# Patient Record
Sex: Male | Born: 1945 | Race: White | Hispanic: No | Marital: Married | Smoking: Never smoker
Health system: Southern US, Community
[De-identification: ages and names within clinical notes are randomized; demographics above are authoritative.]

## PROBLEM LIST (undated history)

## (undated) DIAGNOSIS — I6523 Occlusion and stenosis of bilateral carotid arteries: Secondary | ICD-10-CM

## (undated) DIAGNOSIS — Z8669 Personal history of other diseases of the nervous system and sense organs: Secondary | ICD-10-CM

## (undated) DIAGNOSIS — D649 Anemia, unspecified: Secondary | ICD-10-CM

## (undated) DIAGNOSIS — D126 Benign neoplasm of colon, unspecified: Secondary | ICD-10-CM

## (undated) DIAGNOSIS — E785 Hyperlipidemia, unspecified: Secondary | ICD-10-CM

## (undated) DIAGNOSIS — G4730 Sleep apnea, unspecified: Secondary | ICD-10-CM

## (undated) DIAGNOSIS — M1990 Unspecified osteoarthritis, unspecified site: Secondary | ICD-10-CM

## (undated) DIAGNOSIS — N400 Enlarged prostate without lower urinary tract symptoms: Secondary | ICD-10-CM

## (undated) HISTORY — PX: TONSILLECTOMY: SUR1361

## (undated) HISTORY — PX: STRABISMUS SURGERY: SHX218

## (undated) HISTORY — DX: Benign prostatic hyperplasia without lower urinary tract symptoms: N40.0

## (undated) HISTORY — PX: COLONOSCOPY: SHX174

---

## 1991-01-28 HISTORY — PX: CATARACT EXTRACTION, BILATERAL: SHX1313

## 2008-06-01 ENCOUNTER — Ambulatory Visit: Payer: Self-pay | Admitting: Unknown Physician Specialty

## 2013-09-26 DIAGNOSIS — D649 Anemia, unspecified: Secondary | ICD-10-CM | POA: Insufficient documentation

## 2013-09-26 DIAGNOSIS — E785 Hyperlipidemia, unspecified: Secondary | ICD-10-CM | POA: Insufficient documentation

## 2014-09-29 DIAGNOSIS — N400 Enlarged prostate without lower urinary tract symptoms: Secondary | ICD-10-CM | POA: Insufficient documentation

## 2014-10-03 ENCOUNTER — Other Ambulatory Visit: Payer: Self-pay | Admitting: Internal Medicine

## 2014-10-03 DIAGNOSIS — Z136 Encounter for screening for cardiovascular disorders: Secondary | ICD-10-CM

## 2014-10-03 DIAGNOSIS — Z87891 Personal history of nicotine dependence: Secondary | ICD-10-CM

## 2014-10-09 ENCOUNTER — Ambulatory Visit: Payer: Self-pay

## 2014-10-12 ENCOUNTER — Ambulatory Visit: Payer: Medicare PPO

## 2014-10-23 ENCOUNTER — Ambulatory Visit
Admission: RE | Admit: 2014-10-23 | Discharge: 2014-10-23 | Disposition: A | Discharge status: Home / Self Care | Payer: Medicare PPO | Source: Ambulatory Visit | Attending: Internal Medicine | Admitting: Internal Medicine

## 2014-10-23 DIAGNOSIS — Z87891 Personal history of nicotine dependence: Secondary | ICD-10-CM

## 2014-10-23 DIAGNOSIS — Z136 Encounter for screening for cardiovascular disorders: Secondary | ICD-10-CM

## 2016-06-12 IMAGING — US US RETROPERITONEAL COMPLETE
1 series · 14 of 25 positions shown · non-contrast
Comparison: None.

CLINICAL DATA: Former smoker.  Abdominal aortic aneurysm screening.

EXAM:
ULTRASOUND RETROPERITONEAL COMPLETE
TECHNIQUE: Ultrasound examination of the abdominal aorta was performed to
evaluate for abdominal aortic aneurysm. The common iliac arteries,
IVC, and kidneys were also evaluated.

[Series 1: us retroperitoneal complete · 0.30mm/px · 14 of 48 slices shown]
[im 1/48]
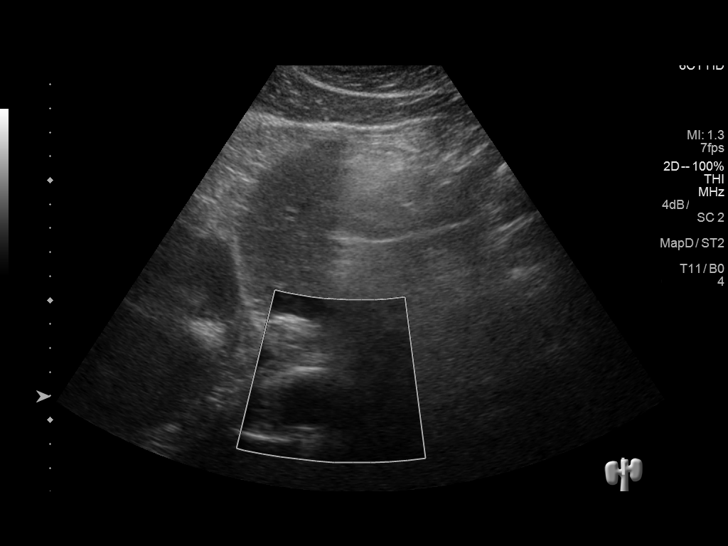
[im 4/48]
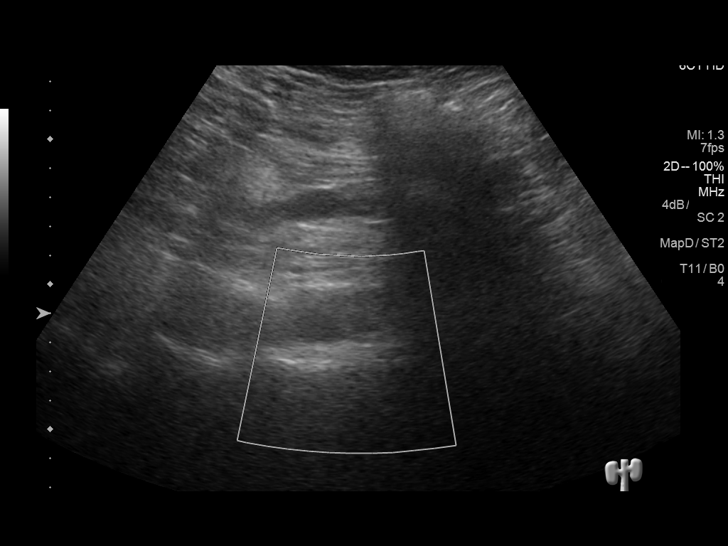
[im 8/48]
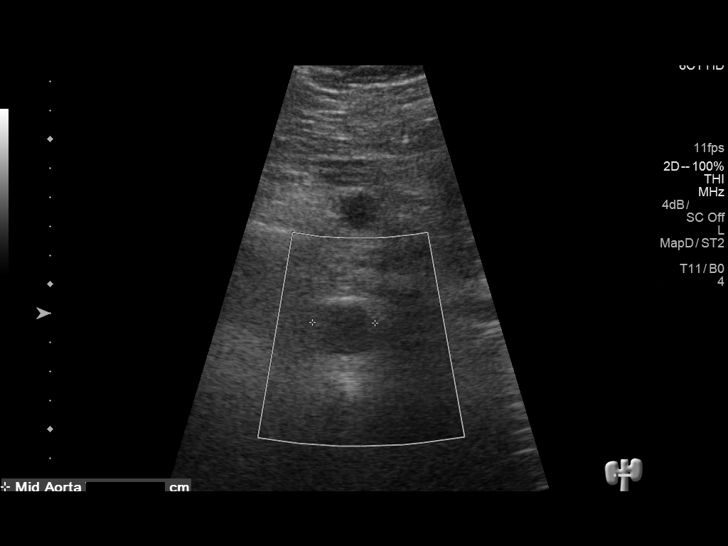
[im 12/48]
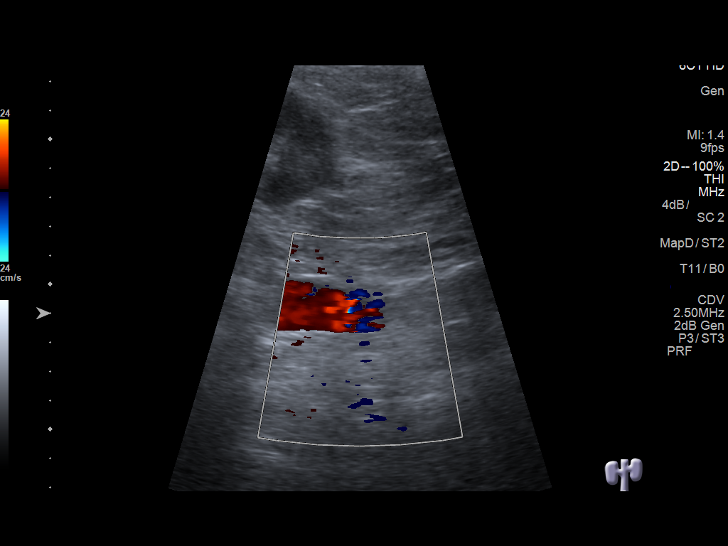
[im 16/48]
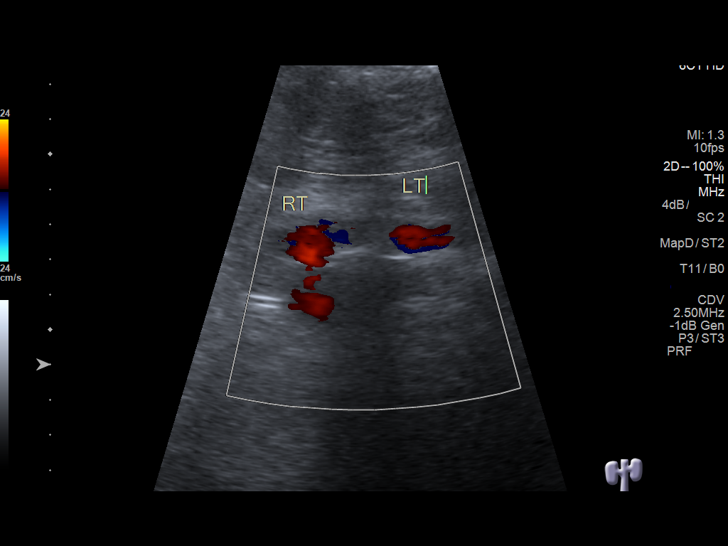
[im 18/48]
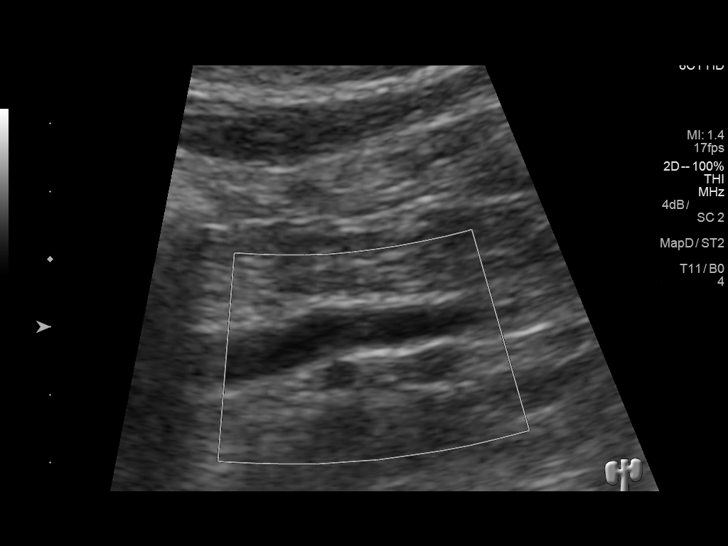
[im 22/48]
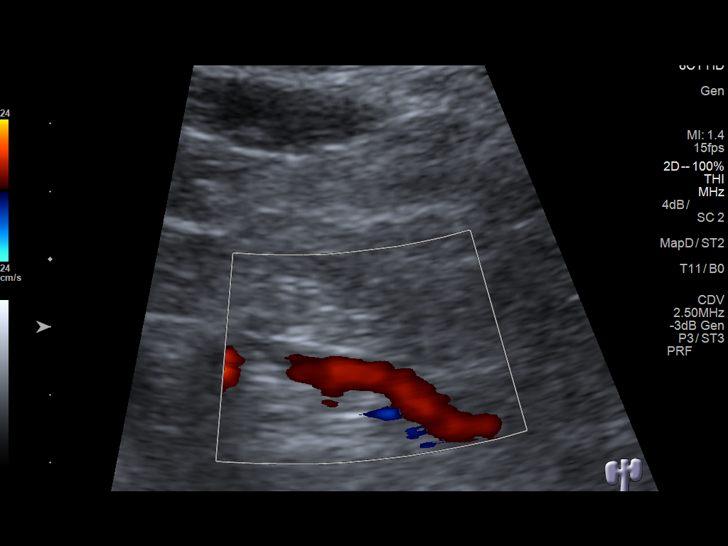
[im 26/48]
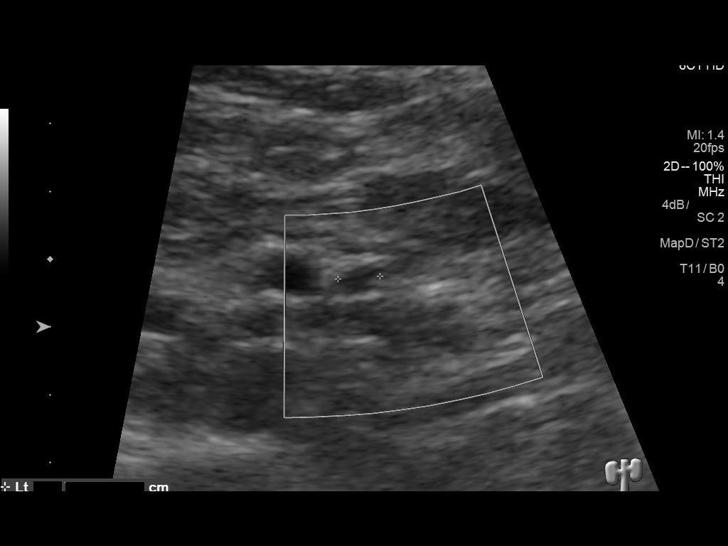
[im 30/48]
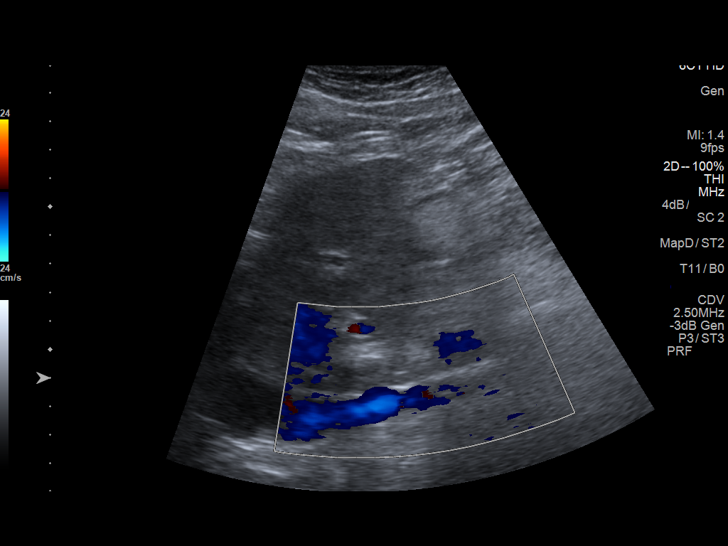
[im 32/48]
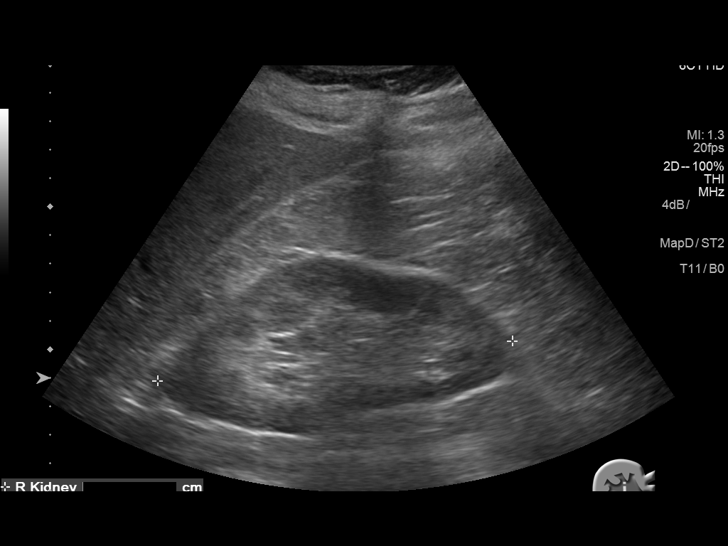
[im 36/48]
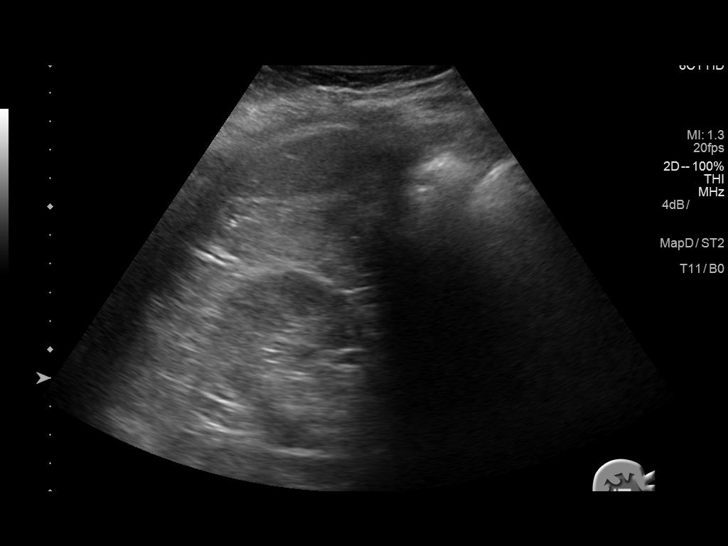
[im 40/48]
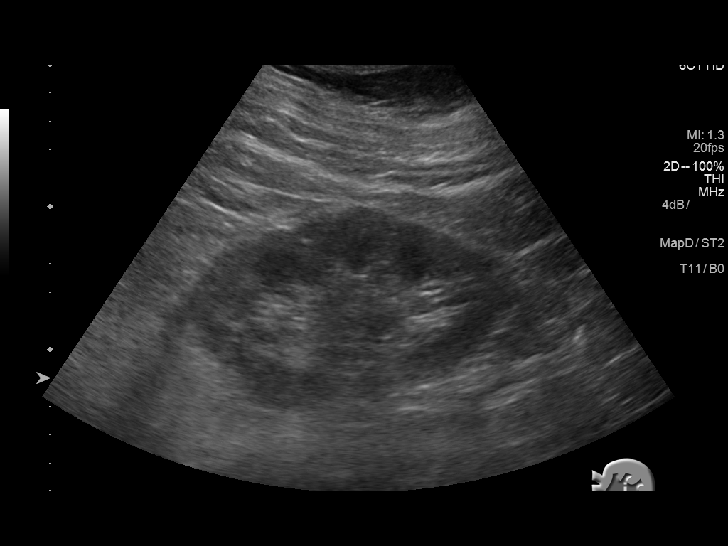
[im 44/48]
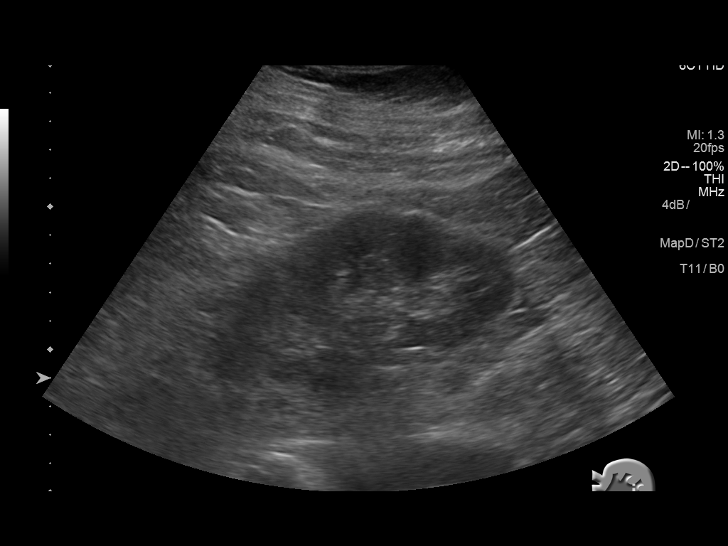
[im 48/48]
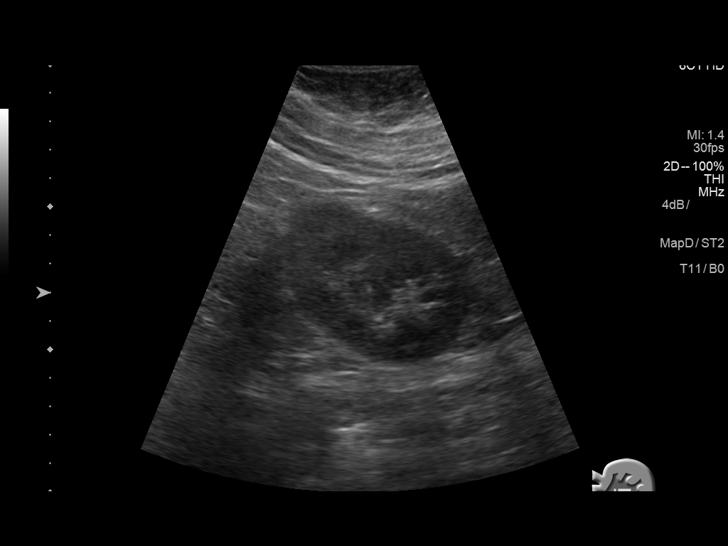

[14 of 25 positions shown; findings below may reference images not displayed]

FINDINGS: Abdominal Aorta

No aneurysm identified.

Maximum AP

Diameter:  2.5 cm

Maximum TRV

Diameter: 2.5 cm

Right Common Iliac Artery

No aneurysm identified.  8 mm diameter.

Left Common Iliac Artery

No aneurysm identified.  6 mm diameter.

IVC

No abnormality visualized.

Right Kidney

Length: 12.5 cm Echogenicity within normal limits. No mass or
hydronephrosis visualized.

Left Kidney

Length: 11.5 cm Echogenicity within normal limits. No mass or
hydronephrosis visualized.
IMPRESSION: No abdominal aortic aneurysm.

## 2019-03-27 ENCOUNTER — Ambulatory Visit: Payer: Medicare PPO | Attending: Internal Medicine

## 2019-03-27 DIAGNOSIS — Z23 Encounter for immunization: Secondary | ICD-10-CM

## 2019-03-27 NOTE — Progress Notes (Signed)
   Covid-19 Vaccination Clinic  Name:  Rodney Schmidt    MRN: [id number] DOB: 04-Oct-1945  03/27/2019  [name] was observed post Covid-19 immunization for 15 minutes without incidence. He was provided with Vaccine Information Sheet and instruction to access the V-Safe system.   [name] was instructed to call 911 with any severe reactions post vaccine: Marland Kitchen Difficulty breathing  . Swelling of your face and throat  . A fast heartbeat  . A bad rash all over your body  . Dizziness and weakness    Immunizations Administered    Name Date Dose VIS Date Route   Pfizer COVID-19 Vaccine 03/27/2019  9:23 AM 0.3 mL 01/07/2019 Intramuscular   Manufacturer: [place]   Lot: [id number]   [place]: [id number]

## 2019-04-18 ENCOUNTER — Ambulatory Visit: Payer: Medicare PPO | Attending: Internal Medicine

## 2019-04-18 DIAGNOSIS — Z23 Encounter for immunization: Secondary | ICD-10-CM

## 2019-04-18 NOTE — Progress Notes (Signed)
   Covid-19 Vaccination Clinic  Name:  Rodney Schmidt    MRN: [id number] DOB: 10/25/1945  04/18/2019  Mr. Rodney Schmidt was observed post Covid-19 immunization for 15 minutes without incident. He was provided with Vaccine Information Sheet and instruction to access the V-Safe system.   Mr. Rodney Schmidt was instructed to call 911 with any severe reactions post vaccine: Marland Kitchen Difficulty breathing  . Swelling of face and throat  . A fast heartbeat  . A bad rash all over body  . Dizziness and weakness   Immunizations Administered    Name Date Dose VIS Date Route   Pfizer COVID-19 Vaccine 04/18/2019  9:02 AM 0.3 mL 01/07/2019 Intramuscular   Manufacturer: [place]   Lot: [id number]   [place]: [id number]

## 2019-10-18 LAB — COLOGUARD: COLOGUARD: NEGATIVE

## 2020-02-07 ENCOUNTER — Encounter: Payer: Self-pay | Admitting: Urology

## 2020-02-07 ENCOUNTER — Other Ambulatory Visit: Payer: Self-pay

## 2020-02-07 ENCOUNTER — Other Ambulatory Visit
Admission: RE | Admit: 2020-02-07 | Discharge: 2020-02-07 | Disposition: A | Discharge status: Home / Self Care | Payer: Medicare PPO | Attending: Urology | Admitting: Urology

## 2020-02-07 ENCOUNTER — Ambulatory Visit: Payer: Medicare PPO | Admitting: Urology

## 2020-02-07 VITALS — BP 167/66 | HR 71 | Ht 64.5 in | Wt 177.8 lb

## 2020-02-07 DIAGNOSIS — N400 Enlarged prostate without lower urinary tract symptoms: Secondary | ICD-10-CM | POA: Diagnosis not present

## 2020-02-07 DIAGNOSIS — Z125 Encounter for screening for malignant neoplasm of prostate: Secondary | ICD-10-CM

## 2020-02-07 DIAGNOSIS — N401 Enlarged prostate with lower urinary tract symptoms: Secondary | ICD-10-CM

## 2020-02-07 DIAGNOSIS — N138 Other obstructive and reflux uropathy: Secondary | ICD-10-CM

## 2020-02-07 DIAGNOSIS — M1990 Unspecified osteoarthritis, unspecified site: Secondary | ICD-10-CM | POA: Insufficient documentation

## 2020-02-07 DIAGNOSIS — H509 Unspecified strabismus: Secondary | ICD-10-CM | POA: Insufficient documentation

## 2020-02-07 LAB — URINALYSIS, COMPLETE (UACMP) WITH MICROSCOPIC
Bacteria, UA: NONE SEEN
Bilirubin Urine: NEGATIVE
Glucose, UA: NEGATIVE mg/dL
Hgb urine dipstick: NEGATIVE
Ketones, ur: NEGATIVE mg/dL
Leukocytes,Ua: NEGATIVE
Nitrite: NEGATIVE
Protein, ur: NEGATIVE mg/dL
Specific Gravity, Urine: 1.02 (ref 1.005–1.030)
Squamous Epithelial / LPF: NONE SEEN (ref 0–5)
pH: 7.5 (ref 5.0–8.0)

## 2020-02-07 LAB — BLADDER SCAN AMB NON-IMAGING

## 2020-02-07 NOTE — Progress Notes (Signed)
   02/07/20 3:14 PM   Pierce Crane Mataya 03-Feb-1945 [id number]  CC: BPH and urinary symptoms, PSA screening  HPI: I saw Mr. Dinapoli in urology clinic for the above issues.  He is a 75 year old very healthy male with long history of bothersome urinary symptoms who has been on maximal medical therapy with Flomax and finasteride over the last 18 months.  His urinary symptoms have continued to worsen, and he is interested in surgical options to improve his urination.  His primary complaints are weak stream, urgency, nocturia 1-2 times overnight.  IPSS score today is 14, with quality of life mixed.  PSA is normal at 0.94, corrected for finasteride 1.8.  Urinalysis today is completely benign.  PVR is 129 mL.   Social History:  reports that he has never smoked. He has never used smokeless tobacco. He reports previous alcohol use. He reports that he does not use drugs.  Physical Exam: BP (!) 167/66 (BP Location: Left Arm, Patient Position: Sitting, Cuff Size: Normal)   Pulse 71   Ht 5' 4.5" (1.638 m)   Wt 177 lb 12.8 oz (80.6 kg)   BMI 30.05 kg/m    Constitutional:  Alert and oriented, No acute distress. Cardiovascular: No clubbing, cyanosis, or edema. Respiratory: Normal respiratory effort, no increased work of breathing. GI: Abdomen is soft, nontender, nondistended, no abdominal masses GU: Phallus with patent meatus, no lesions, testicles 20 cc and descended bilaterally DRE :30 g, smooth, no nodules or masses  Laboratory Data: Reviewed, see HPI  Assessment & Plan:   75 year old very healthy male with persistent bothersome urinary BPH symptoms despite maximal medical therapy of Flomax and finasteride.  DRE is benign and shows a small prostate.  I recommended cystoscopy and TRUS for further evaluation, and consideration of UroLift.  We discussed the differences between UroLift and HOLEP, and with his small prostate and UroLift may be a better option.  Risks and benefits discussed at  length.  Continue Flomax and finasteride Follow-up for cystoscopy and TRUS for consideration of outlet procedure  [name], MD 02/07/2020  [facility] [address], [place], [place] [address] [phone]

## 2020-02-07 NOTE — Patient Instructions (Signed)
Cystoscopy Cystoscopy is a procedure that is used to help diagnose and sometimes treat conditions that affect the lower urinary tract. The lower urinary tract includes the bladder and the urethra. The urethra is the tube that drains urine from the bladder. Cystoscopy is done using a thin, tube-shaped instrument with a light and camera at the end (cystoscope). The cystoscope may be hard or flexible, depending on the goal of the procedure. The cystoscope is inserted through the urethra, into the bladder. Cystoscopy may be recommended if you have:  Urinary tract infections that keep coming back.  Blood in the urine (hematuria).  An inability to control when you urinate (urinary incontinence) or an overactive bladder.  Unusual cells found in a urine sample.  A blockage in the urethra, such as a urinary stone.  Painful urination.  An abnormality in the bladder found during an intravenous pyelogram (IVP) or CT scan. Cystoscopy may also be done to remove a sample of tissue to be examined under a microscope (biopsy). What are the risks? Generally, this is a safe procedure. However, problems may occur, including:  Infection.  Bleeding.  What happens during the procedure?  1. You will be given one or more of the following: ? A medicine to numb the area (local anesthetic). 2. The area around the opening of your urethra will be cleaned. 3. The cystoscope will be passed through your urethra into your bladder. 4. Germ-free (sterile) fluid will flow through the cystoscope to fill your bladder. The fluid will stretch your bladder so that your health care provider can clearly examine your bladder walls. 5. Your doctor will look at the urethra and bladder. 6. The cystoscope will be removed The procedure may vary among health care providers  What can I expect after the procedure? After the procedure, it is common to have: 1. Some soreness or pain in your abdomen and urethra. 2. Urinary symptoms.  These include: ? Mild pain or burning when you urinate. Pain should stop within a few minutes after you urinate. This may last for up to 1 week. ? A small amount of blood in your urine for several days. ? Feeling like you need to urinate but producing only a small amount of urine. Follow these instructions at home: General instructions  Return to your normal activities as told by your health care provider.   Do not drive for 24 hours if you were given a sedative during your procedure.  Watch for any blood in your urine. If the amount of blood in your urine increases, call your health care provider.  If a tissue sample was removed for testing (biopsy) during your procedure, it is up to you to get your test results. Ask your health care provider, or the department that is doing the test, when your results will be ready.  Drink enough fluid to keep your urine pale yellow.  Keep all follow-up visits as told by your health care provider. This is important. Contact a health care provider if you:  Have pain that gets worse or does not get better with medicine, especially pain when you urinate.  Have trouble urinating.  Have more blood in your urine. Get help right away if you:  Have blood clots in your urine.  Have abdominal pain.  Have a fever or chills.  Are unable to urinate. Summary  Cystoscopy is a procedure that is used to help diagnose and sometimes treat conditions that affect the lower urinary tract.  Cystoscopy is done using   a thin, tube-shaped instrument with a light and camera at the end.  After the procedure, it is common to have some soreness or pain in your abdomen and urethra.  Watch for any blood in your urine. If the amount of blood in your urine increases, call your health care provider.  If you were prescribed an antibiotic medicine, take it as told by your health care provider. Do not stop taking the antibiotic even if you start to feel better. This  information is not intended to replace advice given to you by your health care provider. Make sure you discuss any questions you have with your health care provider. Document Revised: 01/05/2018 Document Reviewed: 01/05/2018 Elsevier Patient Education  2020 [employer]vier Inc[employer]   

## 2020-02-23 ENCOUNTER — Other Ambulatory Visit: Payer: Self-pay

## 2020-02-23 ENCOUNTER — Encounter: Payer: Self-pay | Admitting: Urology

## 2020-02-23 ENCOUNTER — Ambulatory Visit (INDEPENDENT_AMBULATORY_CARE_PROVIDER_SITE_OTHER): Payer: Medicare PPO | Admitting: Urology

## 2020-02-23 VITALS — BP 160/75 | HR 78 | Ht 64.5 in | Wt 174.2 lb

## 2020-02-23 DIAGNOSIS — N138 Other obstructive and reflux uropathy: Secondary | ICD-10-CM | POA: Diagnosis not present

## 2020-02-23 DIAGNOSIS — N401 Enlarged prostate with lower urinary tract symptoms: Secondary | ICD-10-CM | POA: Diagnosis not present

## 2020-02-23 MED ORDER — LIDOCAINE HCL URETHRAL/MUCOSAL 2 % EX GEL
1.0000 | Freq: Once | CUTANEOUS | Status: AC
Start: 2020-02-23 — End: 2020-02-23
  Administered 2020-02-23: 1 via URETHRAL

## 2020-02-23 NOTE — Progress Notes (Signed)
Cystoscopy Procedure Note:  Indication: BPH and urinary symptoms  After informed consent and discussion of the procedure and its risks, Rodney Schmidt was positioned and prepped in the standard fashion. Cystoscopy was performed with a flexible cystoscope. The urethra, bladder neck and entire bladder was visualized in a standard fashion. The prostate with obstructing lateral lobes and a high bladder neck with very small median lobe. The ureteral orifices were visualized in their normal location and orientation.  Bladder mucosa with mild trabeculations, no abnormalities on retroflexion and minimal intravesical protrusion  Imaging: The transrectal ultrasound probe was inserted into the rectum and measurements were taken to calculated prostate volume of 26 g  Findings: Obstructive appearing 26 g prostate, very small median lobe  ---------------------------------------------------------------------------------------------  Assessment and Plan: 75 year old male with bothersome urinary symptoms despite maximal medical therapy of Flomax and finasteride he is interested in outlet procedures.  Cystoscopy and TRUS today showed a 26 g prostate with obstructing lateral lobes and a very small median lobe.  We reviewed the options at length including ongoing medical therapy, UroLift, or HOLEP.  We discussed that UroLift is the least invasive option in patients typically discharge the same day without a catheter.  Small risk of bleeding, infection, temporary urinary symptoms of urgency/frequency/dysuria/hematuria.  Extremely low risk of incontinence, no risk of erectile dysfunction or retrograde ejaculation.  We discussed the risks and benefits of HoLEP at length.  The procedure requires general anesthesia and takes 2 to 3 hours, and a holmium laser is used to enucleate the prostate and push this tissue into the bladder.  A morcellator is then used to remove this tissue, which is sent for pathology.  The vast  majority of patients are able to discharge the same day with a catheter in place for 2 to 3 days, and will follow-up in clinic for a voiding trial.  Approximately 5% of patients will be admitted overnight to monitor the urine, or if they have multiple co-morbidities.  We specifically discussed the risks of bleeding, infection, retrograde ejaculation, temporary urgency and urge incontinence, very low risk of long-term incontinence, pathologic evaluation of prostate tissue and possible detection of prostate cancer or other malignancy, and possible need for additional procedures.  He would like to pursue UroLift, will schedule  [name], MD 02/23/2020

## 2020-05-16 ENCOUNTER — Telehealth: Payer: Self-pay | Admitting: Urology

## 2020-05-16 NOTE — Telephone Encounter (Signed)
Patient called the office today to follow up on his visit with Dr. Diamantina Providence in January.  He is wanting to schedule surgery for Urolift.  Please contact patient to make the necessary arrangements.

## 2020-05-16 NOTE — Telephone Encounter (Signed)
Please advise.  Per Denny Peon, she does not have a surgery posting sheet on this patient for Urolift.  Do you still want him to be scheduled for Urolift

## 2020-05-17 ENCOUNTER — Other Ambulatory Visit: Payer: Self-pay | Admitting: Urology

## 2020-05-17 DIAGNOSIS — N138 Other obstructive and reflux uropathy: Secondary | ICD-10-CM

## 2020-05-18 ENCOUNTER — Other Ambulatory Visit: Payer: Self-pay

## 2020-05-18 ENCOUNTER — Other Ambulatory Visit: Payer: Medicare PPO

## 2020-05-18 DIAGNOSIS — N400 Enlarged prostate without lower urinary tract symptoms: Secondary | ICD-10-CM

## 2020-05-18 LAB — URINALYSIS, COMPLETE
Bilirubin, UA: NEGATIVE
Glucose, UA: NEGATIVE
Leukocytes,UA: NEGATIVE
Nitrite, UA: NEGATIVE
Protein,UA: NEGATIVE
RBC, UA: NEGATIVE
Specific Gravity, UA: 1.02 (ref 1.005–1.030)
Urobilinogen, Ur: 0.2 mg/dL (ref 0.2–1.0)
pH, UA: 5 (ref 5.0–7.5)

## 2020-05-18 LAB — MICROSCOPIC EXAMINATION: Bacteria, UA: NONE SEEN

## 2020-05-22 ENCOUNTER — Other Ambulatory Visit
Admission: RE | Admit: 2020-05-22 | Discharge: 2020-05-22 | Disposition: A | Discharge status: Home / Self Care | Payer: Medicare PPO | Source: Ambulatory Visit | Attending: Urology | Admitting: Urology

## 2020-05-22 ENCOUNTER — Other Ambulatory Visit: Payer: Self-pay

## 2020-05-22 ENCOUNTER — Encounter
Admission: RE | Admit: 2020-05-22 | Discharge: 2020-05-22 | Disposition: A | Discharge status: Home / Self Care | Payer: Medicare PPO | Source: Ambulatory Visit | Attending: Urology | Admitting: Urology

## 2020-05-22 DIAGNOSIS — Z01810 Encounter for preprocedural cardiovascular examination: Secondary | ICD-10-CM | POA: Diagnosis present

## 2020-05-22 HISTORY — DX: Hyperlipidemia, unspecified: E78.5

## 2020-05-22 HISTORY — DX: Anemia, unspecified: D64.9

## 2020-05-22 HISTORY — DX: Unspecified osteoarthritis, unspecified site: M19.90

## 2020-05-22 HISTORY — DX: Personal history of other diseases of the nervous system and sense organs: Z86.69

## 2020-05-22 HISTORY — DX: Benign neoplasm of colon, unspecified: D12.6

## 2020-05-22 NOTE — Patient Instructions (Signed)
Your procedure is scheduled on: 05/25/20 Report to [facility]. To find out your arrival time please call [phone] between 1PM - 3PM on 05/24/20.  Remember: Instructions that are not followed completely may result in serious medical risk, up to and including death, or upon the discretion of your surgeon and anesthesiologist your surgery may need to be rescheduled.     _X__ 1. Do not eat food after midnight the night before your procedure.                 No gum chewing or hard candies. You may drink clear liquids up to 2 hours                 before you are scheduled to arrive for your surgery- DO not drink clear                 liquids within 2 hours of the start of your surgery.                 Clear Liquids include:  water, apple juice without pulp, clear carbohydrate                 drink such as Clearfast or Gatorade, Black Coffee or Tea (Do not add                 anything to coffee or tea). Diabetics water only  __X__2.  On the morning of surgery brush your teeth with toothpaste and water, you                 may rinse your mouth with mouthwash if you wish.  Do not swallow any              toothpaste of mouthwash.     _X__ 3.  No Alcohol for 24 hours before or after surgery.   _X__ 4.  Do Not Smoke or use e-cigarettes For 24 Hours Prior to Your Surgery.                 Do not use any chewable tobacco products for at least 6 hours prior to                 surgery.  ____  5.  Bring all medications with you on the day of surgery if instructed.   __X__  6.  Notify your doctor if there is any change in your medical condition      (cold, fever, infections).     Do not wear jewelry, make-up, hairpins, clips or nail polish. Do not wear lotions, powders, or perfumes.  Do not shave 48 hours prior to surgery. Men may shave face and neck. Do not bring valuables to the hospital.    [facility] is not responsible for any belongings or  valuables.  Contacts, dentures/partials or body piercings may not be worn into surgery. Bring a case for your contacts, glasses or hearing aids, a denture cup will be supplied. Leave your suitcase in the car. After surgery it may be brought to your room. For patients admitted to the hospital, discharge time is determined by your treatment team.   Patients discharged the day of surgery will not be allowed to drive home.   Please read over the following fact sheets that you were given:     __X__ Take these medicines the morning of surgery with A SIP OF WATER:  1. finasteride (PROSCAR) 5 MG tablet  2. tamsulosin (FLOMAX) 0.4 MG CAPS capsule  3.   4.  5.  6.  ____ Fleet Enema (as directed)   ____ Use CHG Soap/SAGE wipes as directed  ____ Use inhalers on the day of surgery  ____ Stop metformin/Janumet/Farxiga 2 days prior to surgery    ____ Take 1/2 of usual insulin dose the night before surgery. No insulin the morning          of surgery.   ____ Stop Blood Thinners Coumadin/Plavix/Xarelto/Pleta/Pradaxa/Eliquis/Effient/Aspirin  on   Or contact your Surgeon, Cardiologist or Medical Doctor regarding  ability to stop your blood thinners  __X__ Stop Anti-inflammatories 7 days before surgery such as Advil, Ibuprofen, Motrin,  BC or Goodies Powder, Naprosyn, Naproxen, Aleve, Aspirin    __X__ Stop all herbal supplements, fish oil or vitamin E until after surgery.    ____ Bring C-Pap to the hospital.

## 2020-05-24 LAB — CULTURE, URINE COMPREHENSIVE

## 2020-05-25 ENCOUNTER — Ambulatory Visit
Admission: RE | Admit: 2020-05-25 | Discharge: 2020-05-25 | Disposition: A | Discharge status: Home / Self Care | Payer: Medicare PPO | Attending: Urology | Admitting: Urology

## 2020-05-25 ENCOUNTER — Encounter
Admission: RE | Disposition: A | Discharge status: Home / Self Care | Payer: Self-pay | Source: Home / Self Care | Attending: Urology

## 2020-05-25 ENCOUNTER — Ambulatory Visit: Payer: Medicare PPO | Admitting: Certified Registered Nurse Anesthetist

## 2020-05-25 ENCOUNTER — Encounter: Payer: Self-pay | Admitting: Urology

## 2020-05-25 DIAGNOSIS — N138 Other obstructive and reflux uropathy: Secondary | ICD-10-CM | POA: Insufficient documentation

## 2020-05-25 DIAGNOSIS — N401 Enlarged prostate with lower urinary tract symptoms: Secondary | ICD-10-CM | POA: Insufficient documentation

## 2020-05-25 DIAGNOSIS — R3915 Urgency of urination: Secondary | ICD-10-CM | POA: Diagnosis not present

## 2020-05-25 DIAGNOSIS — R351 Nocturia: Secondary | ICD-10-CM | POA: Diagnosis not present

## 2020-05-25 HISTORY — PX: CYSTOSCOPY WITH INSERTION OF UROLIFT: SHX6678

## 2020-05-25 SURGERY — CYSTOSCOPY WITH INSERTION OF UROLIFT
Anesthesia: General | Site: Prostate

## 2020-05-25 MED ORDER — LIDOCAINE HCL (CARDIAC) PF 100 MG/5ML IV SOSY
PREFILLED_SYRINGE | INTRAVENOUS | Status: DC | PRN
  Administered 2020-05-25: 50 mg via INTRAVENOUS

## 2020-05-25 MED ORDER — LACTATED RINGERS IV SOLN: INTRAVENOUS | Status: DC

## 2020-05-25 MED ORDER — ONDANSETRON HCL 4 MG/2ML IJ SOLN: 4.0000 mg | Freq: Once | INTRAMUSCULAR | Status: DC | PRN

## 2020-05-25 MED ORDER — PROPOFOL 500 MG/50ML IV EMUL
INTRAVENOUS | Status: AC
  Filled 2020-05-25: qty 50

## 2020-05-25 MED ORDER — LIDOCAINE HCL URETHRAL/MUCOSAL 2 % EX GEL
CUTANEOUS | Status: AC
  Filled 2020-05-25: qty 10

## 2020-05-25 MED ORDER — SEVOFLURANE IN SOLN
RESPIRATORY_TRACT | Status: AC
  Filled 2020-05-25: qty 250

## 2020-05-25 MED ORDER — FENTANYL CITRATE (PF) 100 MCG/2ML IJ SOLN
INTRAMUSCULAR | Status: DC | PRN
  Administered 2020-05-25 (×2): 50 ug via INTRAVENOUS

## 2020-05-25 MED ORDER — FAMOTIDINE 20 MG PO TABS: 20.0000 mg | ORAL_TABLET | Freq: Once | ORAL | Status: AC

## 2020-05-25 MED ORDER — ORAL CARE MOUTH RINSE: 15.0000 mL | Freq: Once | OROMUCOSAL | Status: AC

## 2020-05-25 MED ORDER — CEFAZOLIN SODIUM-DEXTROSE 2-4 GM/100ML-% IV SOLN
INTRAVENOUS | Status: AC
  Filled 2020-05-25: qty 100

## 2020-05-25 MED ORDER — PROPOFOL 500 MG/50ML IV EMUL
INTRAVENOUS | Status: DC | PRN
  Administered 2020-05-25: 125 ug/kg/min via INTRAVENOUS

## 2020-05-25 MED ORDER — PROPOFOL 10 MG/ML IV BOLUS
INTRAVENOUS | Status: DC | PRN
  Administered 2020-05-25 (×2): 20 mg via INTRAVENOUS

## 2020-05-25 MED ORDER — FENTANYL CITRATE (PF) 100 MCG/2ML IJ SOLN
INTRAMUSCULAR | Status: AC
  Filled 2020-05-25: qty 2

## 2020-05-25 MED ORDER — ONDANSETRON HCL 4 MG/2ML IJ SOLN
INTRAMUSCULAR | Status: DC | PRN
  Administered 2020-05-25: 4 mg via INTRAVENOUS

## 2020-05-25 MED ORDER — FAMOTIDINE 20 MG PO TABS
ORAL_TABLET | ORAL | Status: AC
  Administered 2020-05-25: 20 mg via ORAL
  Filled 2020-05-25: qty 1

## 2020-05-25 MED ORDER — CHLORHEXIDINE GLUCONATE 0.12 % MT SOLN: 15.0000 mL | Freq: Once | OROMUCOSAL | Status: AC

## 2020-05-25 MED ORDER — HYDROCODONE-ACETAMINOPHEN 5-325 MG PO TABS: 1.0000 | ORAL_TABLET | ORAL | 0 refills | Status: AC | PRN

## 2020-05-25 MED ORDER — DEXAMETHASONE SODIUM PHOSPHATE 10 MG/ML IJ SOLN
INTRAMUSCULAR | Status: DC | PRN
  Administered 2020-05-25: 10 mg via INTRAVENOUS

## 2020-05-25 MED ORDER — CEFAZOLIN SODIUM-DEXTROSE 2-4 GM/100ML-% IV SOLN
2.0000 g | INTRAVENOUS | Status: AC
Start: 2020-05-25 — End: 2020-05-25
  Administered 2020-05-25: 2 g via INTRAVENOUS

## 2020-05-25 MED ORDER — FENTANYL CITRATE (PF) 100 MCG/2ML IJ SOLN: 25.0000 ug | INTRAMUSCULAR | Status: DC | PRN

## 2020-05-25 MED ORDER — CHLORHEXIDINE GLUCONATE 0.12 % MT SOLN
OROMUCOSAL | Status: AC
  Administered 2020-05-25: 15 mL via OROMUCOSAL
  Filled 2020-05-25: qty 15

## 2020-05-25 SURGICAL SUPPLY — 17 items
BAG DRAIN CYSTO-URO LG1000N (MISCELLANEOUS) ×2 IMPLANT
BRUSH SCRUB EZ  4% CHG (MISCELLANEOUS) ×1
BRUSH SCRUB EZ 4% CHG (MISCELLANEOUS) ×1 IMPLANT
GLOVE SURG UNDER POLY LF SZ7.5 (GLOVE) ×2 IMPLANT
GOWN STRL REUS W/ TWL LRG LVL3 (GOWN DISPOSABLE) ×1 IMPLANT
GOWN STRL REUS W/ TWL XL LVL3 (GOWN DISPOSABLE) ×1 IMPLANT
GOWN STRL REUS W/TWL LRG LVL3 (GOWN DISPOSABLE) ×2
GOWN STRL REUS W/TWL XL LVL3 (GOWN DISPOSABLE) ×2
KIT TURNOVER CYSTO (KITS) ×2 IMPLANT
MANIFOLD NEPTUNE II (INSTRUMENTS) ×2 IMPLANT
PACK CYSTO AR (MISCELLANEOUS) ×2 IMPLANT
SET CYSTO W/LG BORE CLAMP LF (SET/KITS/TRAYS/PACK) ×2 IMPLANT
SURGILUBE 2OZ TUBE FLIPTOP (MISCELLANEOUS) IMPLANT
SYSTEM ATC UROLIFT (Male Continence) ×2 IMPLANT
SYSTEM UROLIFT (Male Continence) ×8 IMPLANT
WATER STERILE IRR 1000ML POUR (IV SOLUTION) ×2 IMPLANT
WATER STERILE IRR 3000ML UROMA (IV SOLUTION) ×2 IMPLANT

## 2020-05-25 NOTE — Anesthesia Preprocedure Evaluation (Signed)
Anesthesia Evaluation  Patient identified by MRN, date of birth, ID band Patient awake    Reviewed: Allergy & Precautions, NPO status , Patient's Chart, lab work & pertinent test results  History of Anesthesia Complications Negative for: history of anesthetic complications  Airway Mallampati: II       Dental   Pulmonary neg sleep apnea, neg COPD, Not current smoker,           Cardiovascular (-) hypertension(-) Past MI and (-) CHF (-) dysrhythmias (-) Valvular Problems/Murmurs     Neuro/Psych neg Seizures    GI/Hepatic Neg liver ROS, neg GERD  ,  Endo/Other  neg diabetes  Renal/GU negative Renal ROS     Musculoskeletal   Abdominal   Peds  Hematology   Anesthesia Other Findings   Reproductive/Obstetrics (+) Breast feeding                              Anesthesia Physical Anesthesia Plan  ASA: I  Anesthesia Plan: General   Post-op Pain Management:    Induction: Intravenous  PONV Risk Score and Plan: 2 and Propofol infusion and TIVA  Airway Management Planned:   Additional Equipment:   Intra-op Plan:   Post-operative Plan:   Informed Consent: I have reviewed the patients History and Physical, chart, labs and discussed the procedure including the risks, benefits and alternatives for the proposed anesthesia with the patient or authorized representative who has indicated his/her understanding and acceptance.       Plan Discussed with:   Anesthesia Plan Comments:         Anesthesia Quick Evaluation

## 2020-05-25 NOTE — Anesthesia Postprocedure Evaluation (Signed)
Anesthesia Post Note  Patient: Rodney Schmidt  Procedure(s) Performed: CYSTOSCOPY WITH INSERTION OF UROLIFT (N/A Prostate)  Patient location during evaluation: PACU Anesthesia Type: General Level of consciousness: awake and alert and oriented Pain management: pain level controlled Vital Signs Assessment: post-procedure vital signs reviewed and stable Respiratory status: spontaneous breathing, nonlabored ventilation and respiratory function stable Cardiovascular status: blood pressure returned to baseline and stable Postop Assessment: no signs of nausea or vomiting Anesthetic complications: no   No complications documented.   Last Vitals:  Vitals:   05/25/20 1130 05/25/20 1139  BP: (!) 101/58 (!) 109/46  Pulse: (!) 57 (!) 55  Resp: 12 16  Temp: (!) 36.3 C (!) 36.2 C  SpO2: 95% 100%    Last Pain:  Vitals:   05/25/20 1139  TempSrc: Temporal  PainSc: 0-No pain                 Brogen Duell

## 2020-05-25 NOTE — Op Note (Signed)
Date of procedure: 05/25/20  Preoperative diagnosis: 1. BPH with obstruction  Postoperative diagnosis: 1. Same  Procedure: 1. UroLift(total of6implants placed)  Surgeon: [name], MD  Anesthesia: General  Complications:None  Intraoperative findings: 1.Small prostate with moderate sized median lobe, open channel at conclusion  JEH:[id number]  Specimens:None  Drains:None  Indication:Rodney Schmidt is a 16 year oldpatient with BPH andbothersome urinary symptoms secondary to BPH. He has persistent bothersome symptoms despite multiple medications. After reviewing the management options for treatment, they elected to proceed with the above surgical procedure(s). We have discussed the potential benefits and risks of the procedure, side effects of the proposed treatment, the likelihood of the patient achieving the goals of the procedure, and any potential problems that might occur during the procedure or recuperation. Informed consent has been obtained.  Description of procedure:  The patient was taken to the operating room andanesthesia was induced. SCDs were placed for DVT prophylaxis. The patient was placed in the dorsal lithotomy position, prepped and draped in the usual sterile fashion, and preoperative antibiotics were administered. A preoperative time-out was performed.  The visual obturator with the20French rigid cystoscope was used to intubate the meatus and a normal-appearing urethra was followed proximally into the bladder. The prostate wassmall in sizewith obstructing lateral lobes and a  moderate sized median lobe. Cystoscopy showed mildbladder trabeculations but no suspicious lesions and the ureteral orifices were orthotopic bilaterally.  I started on the right side by placing a UroLift implant at the apex 1 cm distal to the bladder neck. An identical implant was placed on the patient's left side. I then moved back to the  verumontanum, and an additional implant was placed on the patient's right side just proximal to the Veru,andan identical procedure was performed on the left side. At this point I re-examined the channel with the visual obturator andthere was residual obstructive tissue from the median lobe.  The advanced tissue control UroLift device was used to place a fifth  implant, and the median lobe was compressed to the left side.  I again used the visual obturator, and there was still some obstructive median tissue, and a 6th and final implant with the advanced tissue control device was used to completely compress the residual median lobe tissue to the side.  There was an excellent anterior channel at this point. The channel was examined with the visual obturator, and it was open from the verumontanum up to the bladder neck.  There was no significant bleeding.  A total of6UroLift implants were placed.  The bladder was left mildly distended, and the patient will need to void prior to discharge.  Disposition: Stable to PACU  Plan: Patient must void prior to discharge Continue Flomax x1 week Follow-up in 4 weeks forIPSS/PVR  [name], MD

## 2020-05-25 NOTE — Transfer of Care (Signed)
Immediate Anesthesia Transfer of Care Note  Patient: Pierce Crane Torgeson  Procedure(s) Performed: CYSTOSCOPY WITH INSERTION OF UROLIFT (N/A Prostate)  Patient Location: PACU  Anesthesia Type:General  Level of Consciousness: awake, alert  and oriented  Airway & Oxygen Therapy: Patient Spontanous Breathing and Patient connected to face mask oxygen  Post-op Assessment: Report given to RN and Post -op Vital signs reviewed and stable  Post vital signs: Reviewed and stable  Last Vitals:  Vitals Value Taken Time  BP 99/52 05/25/20 1103  Temp    Pulse 59 05/25/20 1104  Resp 17 05/25/20 1104  SpO2 97 % 05/25/20 1104  Vitals shown include unvalidated device data.  Last Pain:  Vitals:   05/25/20 0716  TempSrc: Tympanic  PainSc: 0-No pain         Complications: No complications documented.

## 2020-05-25 NOTE — Discharge Instructions (Signed)

## 2020-05-25 NOTE — H&P (Signed)
   05/25/20 10:10 AM   Rodney Schmidt 01-Aug-1945 [id number]  CC: BPH  HPI:  He is a 75 year old very healthy male with long history of bothersome urinary symptoms who has been on maximal medical therapy with Flomax and finasteride over the last 18 months.  His urinary symptoms have continued to worsen, and he is interested in surgical options to improve his urination.  His primary complaints are weak stream, urgency, nocturia 1-2 times overnight.  IPSS score today is 14, with quality of life mixed.  PSA is normal at 0.94, corrected for finasteride 1.8.  PVR is 129 mL.   PMH: Past Medical History:  Diagnosis Date  . Anemia   . Benign neoplasm of colon   . BPH (benign prostatic hyperplasia)   . HLD (hyperlipidemia)   . Hx of strabismus   . Osteoarthritis     Surgical History: Past Surgical History:  Procedure Laterality Date  . CATARACT EXTRACTION, BILATERAL  1993  . COLONOSCOPY    . STRABISMUS SURGERY    . TONSILLECTOMY      Family History: History reviewed. No pertinent family history.  Social History:  reports that he has never smoked. He has never used smokeless tobacco. He reports current alcohol use of about 2.0 standard drinks of alcohol per week. He reports that he does not use drugs.  Physical Exam: BP (!) 89/76   Pulse 65   Temp (!) 96.8 F (36 C) (Tympanic)   Resp 16   Ht 5\' 5"  (1.651 m)   Wt 67.1 kg   SpO2 96%   BMI 24.63 kg/m    Constitutional:  Alert and oriented, No acute distress. Cardiovascular: Regular rate and rhythm Respiratory: Clear to auscultation bilaterally GI: Abdomen is soft, nontender, nondistended, no abdominal masses   Laboratory Data: Urine culture 4/22 no growth   Assessment & Plan:   75 year old male with 26 g prostate and obstructive urinary symptoms from BPH.  He opted for UroLift.  We discussed the risks and benefits including bleeding, infection, need for additional procedures in the future, possible catheter  placement, and irritative urinary symptoms for 1 to 2 weeks.  UroLift today  [name], MD 05/25/2020  [facility] [address], [place], [place] [address] [phone]

## 2020-07-23 ENCOUNTER — Other Ambulatory Visit: Payer: Self-pay | Admitting: Family Medicine

## 2020-07-23 ENCOUNTER — Other Ambulatory Visit (HOSPITAL_COMMUNITY): Payer: Self-pay | Admitting: Family Medicine

## 2020-07-23 DIAGNOSIS — R55 Syncope and collapse: Secondary | ICD-10-CM

## 2020-07-23 DIAGNOSIS — R42 Dizziness and giddiness: Secondary | ICD-10-CM

## 2020-08-06 ENCOUNTER — Ambulatory Visit: Admission: RE | Admit: 2020-08-06 | Payer: Medicare PPO | Source: Ambulatory Visit

## 2021-12-19 ENCOUNTER — Emergency Department: Payer: Medicare Other

## 2021-12-19 ENCOUNTER — Emergency Department
Admission: EM | Admit: 2021-12-19 | Discharge: 2021-12-19 | Disposition: A | Discharge status: Home / Self Care | Payer: Medicare Other | Attending: Emergency Medicine | Admitting: Emergency Medicine

## 2021-12-19 ENCOUNTER — Other Ambulatory Visit: Payer: Self-pay

## 2021-12-19 DIAGNOSIS — K4090 Unilateral inguinal hernia, without obstruction or gangrene, not specified as recurrent: Secondary | ICD-10-CM | POA: Insufficient documentation

## 2021-12-19 DIAGNOSIS — R102 Pelvic and perineal pain: Secondary | ICD-10-CM | POA: Diagnosis present

## 2021-12-19 LAB — URINALYSIS, ROUTINE W REFLEX MICROSCOPIC
Bilirubin Urine: NEGATIVE
Glucose, UA: NEGATIVE mg/dL
Hgb urine dipstick: NEGATIVE
Ketones, ur: 5 mg/dL — AB
Leukocytes,Ua: NEGATIVE
Nitrite: NEGATIVE
Protein, ur: NEGATIVE mg/dL
Specific Gravity, Urine: 1.02 (ref 1.005–1.030)
pH: 6 (ref 5.0–8.0)

## 2021-12-19 LAB — CBC WITH DIFFERENTIAL/PLATELET
Abs Immature Granulocytes: 0.05 10*3/uL (ref 0.00–0.07)
Basophils Absolute: 0.1 10*3/uL (ref 0.0–0.1)
Basophils Relative: 0 %
Eosinophils Absolute: 0.1 10*3/uL (ref 0.0–0.5)
Eosinophils Relative: 1 %
HCT: 44.7 % (ref 39.0–52.0)
Hemoglobin: 15 g/dL (ref 13.0–17.0)
Immature Granulocytes: 0 %
Lymphocytes Relative: 17 %
Lymphs Abs: 1.9 10*3/uL (ref 0.7–4.0)
MCH: 30.1 pg (ref 26.0–34.0)
MCHC: 33.6 g/dL (ref 30.0–36.0)
MCV: 89.8 fL (ref 80.0–100.0)
Monocytes Absolute: 0.5 10*3/uL (ref 0.1–1.0)
Monocytes Relative: 4 %
Neutro Abs: 8.7 10*3/uL — ABNORMAL HIGH (ref 1.7–7.7)
Neutrophils Relative %: 78 %
Platelets: 263 10*3/uL (ref 150–400)
RBC: 4.98 MIL/uL (ref 4.22–5.81)
RDW: 12.8 % (ref 11.5–15.5)
WBC: 11.3 10*3/uL — ABNORMAL HIGH (ref 4.0–10.5)
nRBC: 0 % (ref 0.0–0.2)

## 2021-12-19 LAB — HEPATIC FUNCTION PANEL
ALT: 15 U/L (ref 0–44)
AST: 21 U/L (ref 15–41)
Albumin: 4.1 g/dL (ref 3.5–5.0)
Alkaline Phosphatase: 56 U/L (ref 38–126)
Bilirubin, Direct: 0.1 mg/dL (ref 0.0–0.2)
Indirect Bilirubin: 1.1 mg/dL — ABNORMAL HIGH (ref 0.3–0.9)
Total Bilirubin: 1.2 mg/dL (ref 0.3–1.2)
Total Protein: 6.8 g/dL (ref 6.5–8.1)

## 2021-12-19 LAB — BASIC METABOLIC PANEL
Anion gap: 8 (ref 5–15)
BUN: 25 mg/dL — ABNORMAL HIGH (ref 8–23)
CO2: 23 mmol/L (ref 22–32)
Calcium: 9.3 mg/dL (ref 8.9–10.3)
Chloride: 107 mmol/L (ref 98–111)
Creatinine, Ser: 0.62 mg/dL (ref 0.61–1.24)
GFR, Estimated: >60 mL/min (ref >60)
Glucose, Bld: 101 mg/dL — ABNORMAL HIGH (ref 70–99)
Potassium: 4.2 mmol/L (ref 3.5–5.1)
Sodium: 138 mmol/L (ref 135–145)

## 2021-12-19 LAB — LIPASE, BLOOD: Lipase: 29 U/L (ref 11–51)

## 2021-12-19 MED ORDER — IOHEXOL 300 MG/ML  SOLN
100.0000 mL | Freq: Once | INTRAMUSCULAR | Status: AC | PRN
  Administered 2021-12-19: 100 mL via INTRAVENOUS

## 2021-12-19 NOTE — Consult Note (Signed)
Subjective:   CC: left inguinal hernia  HPI:  Rodney Schmidt is a 76 y.o. male who was referred by [facility] for evaluation of above cc.   Symptoms were first noted last night.  Sudden onset, sharp, localized to area.  Associated with increasing lump, exacerbated by touch.  Couple BM and then lump and pain resolved.  Currently asymptomatic.     Past Medical History:  has a past medical history of Anemia, Benign neoplasm of colon, BPH (benign prostatic hyperplasia), HLD (hyperlipidemia), strabismus, and Osteoarthritis.  Past Surgical History:  Past Surgical History:  Procedure Laterality Date   CATARACT EXTRACTION, BILATERAL  1993   COLONOSCOPY     CYSTOSCOPY WITH INSERTION OF UROLIFT N/A 05/25/2020   Procedure: CYSTOSCOPY WITH INSERTION OF UROLIFT;  Surgeon: Billey Co, MD;  Location: [facility] ORS;  Service: Urology;  Laterality: N/A;   STRABISMUS SURGERY     TONSILLECTOMY      Family History: family history is not on file.  Social History:  reports that he has never smoked. He has never used smokeless tobacco. He reports current alcohol use of about 2.0 standard drinks of alcohol per week. He reports that he does not use drugs.  Current Medications:  Prior to Admission medications   Medication Sig Start Date End Date Taking? Authorizing Provider  Apoaequorin (PREVAGEN PO) Take 1 capsule by mouth in the morning.    [provider]  ibuprofen (ADVIL) 200 MG tablet Take 200-400 mg by mouth every 8 (eight) hours as needed (pain).    [provider]  Multiple Vitamin (MULTIVITAMIN WITH MINERALS) TABS tablet Take 1 tablet by mouth daily. [facility] One Daily Men's Health Formula    [provider]    Allergies:  Allergies as of 12/19/2021   (No Known Allergies)    ROS:  General: Denies weight loss, weight gain, fatigue, fevers, chills, and night sweats. Eyes: Denies blurry vision, double vision, eye pain, itchy eyes, and tearing. Ears: Denies hearing  loss, earache, and ringing in ears. Nose: Denies sinus pain, congestion, infections, runny nose, and nosebleeds. Mouth/throat: Denies hoarseness, sore throat, bleeding gums, and difficulty swallowing. Heart: Denies chest pain, palpitations, racing heart, irregular heartbeat, leg pain or swelling, and decreased activity tolerance. Respiratory: Denies breathing difficulty, shortness of breath, wheezing, cough, and sputum. GI: Denies change in appetite, heartburn, nausea, vomiting, constipation, diarrhea, and blood in stool. GU: Denies difficulty urinating, pain with urinating, urgency, frequency, blood in urine. Musculoskeletal: Denies joint stiffness, pain, swelling, muscle weakness. Skin: Denies rash, itching, mass, tumors, sores, and boils Neurologic: Denies headache, fainting, dizziness, seizures, numbness, and tingling. Psychiatric: Denies depression, anxiety, difficulty sleeping, and memory loss. Endocrine: Denies heat or cold intolerance, and increased thirst or urination. Blood/lymph: Denies easy bruising, easy bruising, and swollen glands     Objective:     BP (!) 141/86 (BP Location: Left Arm)   Pulse 69   Temp 98.4 F (36.9 C) (Oral)   Resp 16   SpO2 95%   Constitutional :  alert, cooperative, appears stated age, and no distress  Lymphatics/Throat:  no asymmetry, masses, or scars  Respiratory:  clear to auscultation bilaterally  Cardiovascular:  regular rate and rhythm  Gastrointestinal: soft, non-tender; bowel sounds normal; no masses,  no organomegaly and   . Left inguinal hernia noted.  Reducible, non-tender, no incarcerated contents.  Musculoskeletal: lying in bed, no obvious difficulty moving upper extremities  Skin: Cool and moist  Psychiatric: Normal affect, non-agitated, not confused  LABS:     Latest Ref Rng & Units 12/19/2021   10:40 AM  CMP  Glucose 70 - 99 mg/dL 101   BUN 8 - 23 mg/dL 25   Creatinine 0.61 - 1.24 mg/dL 0.62   Sodium 135 - 145  mmol/L 138   Potassium 3.5 - 5.1 mmol/L 4.2   Chloride 98 - 111 mmol/L 107   CO2 22 - 32 mmol/L 23   Calcium 8.9 - 10.3 mg/dL 9.3   Total Protein 6.5 - 8.1 g/dL 6.8   Total Bilirubin 0.3 - 1.2 mg/dL 1.2   Alkaline Phos 38 - 126 U/L 56   AST 15 - 41 U/L 21   ALT 0 - 44 U/L 15       Latest Ref Rng & Units 12/19/2021   10:40 AM  CBC  WBC 4.0 - 10.5 K/uL 11.3   Hemoglobin 13.0 - 17.0 g/dL 15.0   Hematocrit 39.0 - 52.0 % 44.7   Platelets 150 - 400 K/uL 263     RADS: CLINICAL DATA:  Left lower quadrant abdominal pain.   EXAM: CT ABDOMEN AND PELVIS WITH CONTRAST   TECHNIQUE: Multidetector CT imaging of the abdomen and pelvis was performed using the standard protocol following bolus administration of intravenous contrast.   RADIATION DOSE REDUCTION: This exam was performed according to the departmental dose-optimization program which includes automated exposure control, adjustment of the mA and/or kV according to patient size and/or use of iterative reconstruction technique.   CONTRAST:  133m OMNIPAQUE IOHEXOL 300 MG/ML  SOLN   COMPARISON:  None Available.   FINDINGS: Lower chest: Unremarkable.   Hepatobiliary: No suspicious focal abnormality within the liver parenchyma. Tiny hypodensity in the inferior right liver is too small to characterize but likely benign. No followup imaging is recommended. There is no evidence for gallstones, gallbladder wall thickening, or pericholecystic fluid.   Pancreas: No focal mass lesion. No dilatation of the main duct. No intraparenchymal cyst. No peripancreatic edema.   Spleen: No splenomegaly.   Adrenals/Urinary Tract: No adrenal nodule or mass. 8 mm low-density lesion interpolar right kidney is too small to characterize but most likely benign. Similar 7 mm hypodensity in the interpolar left kidney. No followup imaging is recommended. No evidence for hydroureter. The urinary bladder appears normal for the degree of distention.    Stomach/Bowel: Stomach is nondistended which may accentuate wall thickening in the antral region. Duodenum is normally positioned as is the ligament of Treitz. Duodenal diverticulum noted. Small bowel loops are fluid-filled and mildly distended up to 2.9 cm diameter. The wall of the terminal ileum is ill-defined and appears mildly thickened (see axial 46/2 and coronal 26/5). The appendix is normal. Prominent stool volume in the right and transverse colon. 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   Vascular/Lymphatic: There is moderate atherosclerotic calcification of the abdominal aorta without aneurysm. There is no gastrohepatic or hepatoduodenal ligament lymphadenopathy. No retroperitoneal or mesenteric lymphadenopathy. No pelvic sidewall lymphadenopathy.   Reproductive: Sequelae of uro lift procedure identified in the prostate gland. Evidence of median lobe hypertrophy.   Other: Small volume free fluid noted in the abdomen at the gallbladder fossa (21/2) and adjacent to the spleen. Trace fluid is seen in the right pelvis (53/2) and deep pelvis (64/2).   Musculoskeletal: Small left groin hernia contains fat and fluid. No worrisome lytic or sclerotic osseous abnormality.   IMPRESSION: 1. Small bowel loops are fluid-filled and mildly distended up to 2.9 cm diameter. The wall of the  terminal ileum is ill-defined and appears mildly thickened. Imaging features could be related to an infectious/inflammatory enteritis. 2. Small volume free fluid in the abdomen and pelvis. 3. Small left groin hernia contains fat and fluid. 4.  Aortic Atherosclerosis (ICD10-I70.0).     Electronically Signed   By: Misty Stanley M.D.   On: 12/19/2021 13:16 Assessment:     Left inuingal hernia, likely had episode of incarceration but spontaneously reduced, now asymptomatic.  CT read above likely sequaele of it. Plan:   Ok to d/c and f/u as outpt to schedule elective repair.   Pt comfortable waiting until after holidays. F/u info provided  The patient verbalized understanding and all questions were answered to the patient's satisfaction.  labs/images/medications/previous chart entries reviewed personally and relevant changes/updates noted above.

## 2021-12-19 NOTE — Discharge Instructions (Addendum)
Please call the general surgery office on Monday to schedule a close follow-up.  You were seen in the emergency room for abdominal pain. It is important that you follow up closely with Dr. Lysle Pearl, our surgeon who saw you today.  Please return to the emergency room right away if you are to develop a fever, severe nausea, your pain becomes severe or worsens, you are unable to keep food down, begin vomiting any dark or bloody fluid, you develop any dark or bloody stools, feel dehydrated, or other new concerns or symptoms arise.

## 2021-12-19 NOTE — ED Triage Notes (Signed)
Pt to ED via POV for pain in his pelvic area. Pt states that the last time he had pain like this it was after he went to the gym. The pain went away but has come back over the last few days.

## 2021-12-19 NOTE — ED Provider Notes (Signed)
[facility] Provider Note   Event Date/Time   First MD Initiated Contact with Patient 12/19/21 1106     (approximate)  History   Pelvic Pain   HPI  Rodney Schmidt is a 76 y.o. male with a history of hyperlipidemia strabismus prostate hyperplasia.  Reviewed primary care notes, appears he had difficulty scheduling his appointment, but has an appointment coming December  Yesterday within normal health.  Suddenly had swelling in his left groin and pain he reports last night he had tremendous pain to the point it made him sweat in his left lower groin.  The area became swollen and his wife told him he may have a bad hernia.  This morning he was able to have 2 bowel movements and reports that the swelling in the left groin improved and is just very minor now and all of his pain is since gone away.  No fevers or chills.  He relates that sometime in October he was working out at [employer] when he suddenly felt a tearing feeling in the same area, it was sore for couple days and went away  No testicular or penile symptoms.  He had normal bowel movements this morning.  No ongoing pain or discomfort     Physical Exam   Triage Vital Signs: ED Triage Vitals  Enc Vitals Group     BP 12/19/21 1036 (!) 141/86     Pulse Rate 12/19/21 1036 69     Resp 12/19/21 1036 16     Temp 12/19/21 1036 98.4 F (36.9 C)     Temp Source 12/19/21 1036 Oral     SpO2 12/19/21 1036 95 %     Weight --      Height --      Head Circumference --      Peak Flow --      Pain Score 12/19/21 1037 2     Pain Loc --      Pain Edu? --      Excl. in [place]? --     Most recent vital signs: Vitals:   12/19/21 1036  BP: (!) 141/86  Pulse: 69  Resp: 16  Temp: 98.4 F (36.9 C)  SpO2: 95%     General: Awake, no distress.  Very pleasant.  Ambulates without difficulty CV:  Good peripheral perfusion.  Normal rate and tones Resp:  Normal effort.  Clear bilateral Abd:  No distention.  Soft  nontender nondistended through all quadrants.  Examined with the patient standing up, he has evidence of a soft but present mass abutting or within the left inguinal canal, it is small and nontender and feels like bowel.  It becomes more prominent as he bears down.  The right inguinal canal is normal.  The scrotum and testicles are normal.  When the patient lays flat also noted over the left pubis region overlying the inguinal canal is a small amount of slight swelling and it is soft to touch, no overlying erythema and it is nontender. Other:     ED Results / Procedures / Treatments   Labs (all labs ordered are listed, but only abnormal results are displayed) Labs Reviewed  CBC WITH DIFFERENTIAL/PLATELET - Abnormal; Notable for the following components:      Result Value   WBC 11.3 (*)    Neutro Abs 8.7 (*)    All other components within normal limits  BASIC METABOLIC PANEL - Abnormal; Notable for the following components:   Glucose,  Bld 101 (*)    BUN 25 (*)    All other components within normal limits  URINALYSIS, ROUTINE W REFLEX MICROSCOPIC - Abnormal; Notable for the following components:   Color, Urine YELLOW (*)    APPearance CLEAR (*)    Ketones, ur 5 (*)    All other components within normal limits  HEPATIC FUNCTION PANEL - Abnormal; Notable for the following components:   Indirect Bilirubin 1.1 (*)    All other components within normal limits  LIPASE, BLOOD     EKG     RADIOLOGY  CT imaging evaluation ordered to evaluate for etiology, suspect inguinal hernia   CT ABDOMEN PELVIS W CONTRAST  Result Date: 12/19/2021 CLINICAL DATA:  Left lower quadrant abdominal pain. EXAM: CT ABDOMEN AND PELVIS WITH CONTRAST TECHNIQUE: Multidetector CT imaging of the abdomen and pelvis was performed using the standard protocol following bolus administration of intravenous contrast. RADIATION DOSE REDUCTION: This exam was performed according to the departmental dose-optimization  program which includes automated exposure control, adjustment of the mA and/or kV according to patient size and/or use of iterative reconstruction technique. CONTRAST:  17m OMNIPAQUE IOHEXOL 300 MG/ML  SOLN COMPARISON:  None Available. FINDINGS: Lower chest: Unremarkable. Hepatobiliary: No suspicious focal abnormality within the liver parenchyma. Tiny hypodensity in the inferior right liver is too small to characterize but likely benign. No followup imaging is recommended. There is no evidence for gallstones, gallbladder wall thickening, or pericholecystic fluid. Pancreas: No focal mass lesion. No dilatation of the main duct. No intraparenchymal cyst. No peripancreatic edema. Spleen: No splenomegaly. Adrenals/Urinary Tract: No adrenal nodule or mass. 8 mm low-density lesion interpolar right kidney is too small to characterize but most likely benign. Similar 7 mm hypodensity in the interpolar left kidney. No followup imaging is recommended. No evidence for hydroureter. The urinary bladder appears normal for the degree of distention. Stomach/Bowel: Stomach is nondistended which may accentuate wall thickening in the antral region. Duodenum is normally positioned as is the ligament of Treitz. Duodenal diverticulum noted. Small bowel loops are fluid-filled and mildly distended up to 2.9 cm diameter. The wall of the terminal ileum is ill-defined and appears mildly thickened (see axial 46/2 and coronal 26/5). The appendix is normal. Prominent stool volume in the right and transverse colon. 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 Vascular/Lymphatic: There is moderate atherosclerotic calcification of the abdominal aorta without aneurysm. There is no gastrohepatic or hepatoduodenal ligament lymphadenopathy. No retroperitoneal or mesenteric lymphadenopathy. No pelvic sidewall lymphadenopathy. Reproductive: Sequelae of uro lift procedure identified in the prostate gland. Evidence of median lobe  hypertrophy. Other: Small volume free fluid noted in the abdomen at the gallbladder fossa (21/2) and adjacent to the spleen. Trace fluid is seen in the right pelvis (53/2) and deep pelvis (64/2). Musculoskeletal: Small left groin hernia contains fat and fluid. No worrisome lytic or sclerotic osseous abnormality. IMPRESSION: 1. Small bowel loops are fluid-filled and mildly distended up to 2.9 cm diameter. The wall of the terminal ileum is ill-defined and appears mildly thickened. Imaging features could be related to an infectious/inflammatory enteritis. 2. Small volume free fluid in the abdomen and pelvis. 3. Small left groin hernia contains fat and fluid. 4.  Aortic Atherosclerosis (ICD10-I70.0). Electronically Signed   By: EMisty StanleyM.D.   On: 12/19/2021 13:16      PROCEDURES:  Critical Care performed: No  Procedures   MEDICATIONS ORDERED IN ED: Medications  iohexol (OMNIPAQUE) 300 MG/ML solution 100 mL (100 mLs Intravenous Contrast Given  12/19/21 1251)     IMPRESSION / MDM / ASSESSMENT AND PLAN / ED COURSE  I reviewed the triage vital signs and the nursing notes.                              Differential diagnosis includes, but is not limited to, inguinal hernia, mass, direct or indirect hernia, tumor, lipoma, or other acute causation.  Based on his clinical history provides seems the patient likely has a left inguinal hernia that last night maybe have become briefly incarcerated as he had severe pain and it was quite swollen, then this morning he was able to have 2 bowel movements and the swelling went down and all of his pain resolved.  He now has an area of soft palpable mass in the left inguinal canal that seems to represent an inguinal hernia that is soft, nontender and with out evidence of strangulation at this time.  CT scan ordered.  Discussed with patient in agreement with the plan.  Patient's presentation is most consistent with acute complicated illness / injury requiring  diagnostic workup.    Patient nontoxic reassuring examination at this time  ----------------------------------------- 1:35 PM on 12/19/2021 ----------------------------------------- Patient currently resting calm pain and symptom-free though he does still have some mild swelling in the left inguinal fold region without tenderness.  He is asymptomatic.  I have discussed his presentation and also his CT imaging which does show some dilated loops of bowel and slight amount of free fluid with Dr. Lysle Pearl.  General surgery will see and provide consultation  ----------------------------------------- 2:02 PM on 12/19/2021 ----------------------------------------- Patient has been seen and evaluated by general surgery Dr. Lysle Pearl.  He advises at this time patient is appropriate for follow-up in his clinic Monday.  No recommendation for admission or surgical intervention.  I am quite agreeable with this plan, patient is currently asymptomatic resting comfortably no pain or distress.  Appropriate for outpatient follow-up and careful return precautions which were provided to the patient  Return precautions and treatment recommendations and follow-up discussed with the patient who is agreeable with the plan.   FINAL CLINICAL IMPRESSION(S) / ED DIAGNOSES   Final diagnoses:  Non-recurrent unilateral inguinal hernia without obstruction or gangrene     Rx / DC Orders   ED Discharge Orders     None        Note:  This document was prepared using Dragon voice recognition software and may include unintentional dictation errors.   Delman Kitten, MD 12/19/21 [phone]

## 2021-12-24 ENCOUNTER — Ambulatory Visit: Payer: Self-pay | Admitting: Surgery

## 2021-12-24 NOTE — H&P (Signed)
Subjective:   CC: Non-recurrent unilateral inguinal hernia without obstruction or gangrene [K40.90]  HPI:  Rodney Schmidt is a 76 y.o. male who returns for above.  No acute changes since last exam.   Past Medical History:  has a past medical history of Anemia (09/26/2013), Benign neoplasm of colon, Cataract cortical, senile (1994), OSA (obstructive sleep apnea) (09/04/2020), Osteoarthritis, Other and unspecified hyperlipidemia (09/26/2013), and Strabismus.  Past Surgical History:  Past Surgical History:  Procedure Laterality Date   CATARACT EXTRACTION Bilateral 1993   bilateral   COLONOSCOPY  06/01/2008   hyperplastic polyp, internal hemorrhoids, repeat 10 years (RTE) CBF 05/2018: Recall ltr mailed    S/P shunt placement     History of borderline intraocular pressures   STRABISMUS EYE SURGERY     multiple   TONSILLECTOMY     urolift      Family History: family history includes Alcohol abuse in his father; Alzheimer's disease in his mother; Cirrhosis in his father; Glaucoma in his mother; Prostate cancer in his brother and brother.  Social History:  reports that he quit smoking about 47 years ago. His smoking use included cigarettes. He has never used smokeless tobacco. He reports that he does not drink alcohol and does not use drugs.  Current Medications: has a current medication list which includes the following prescription(s): cyanocobalamin (vitamin b-12), glucosamine/chondr su a sod, multivitamin, turmeric, apoaequorin, and sildenafil.  Allergies:  Allergies as of 12/24/2021   (No Known Allergies)    ROS:  A 15 point review of systems was performed and pertinent positives and negatives noted in HPI   Objective:     BP 128/62   Pulse 69   Ht 165.1 cm ('5\' 5"'$ )   Wt 63.5 kg (140 lb)   BMI 23.30 kg/m   Constitutional :  Alert, cooperative, no distress  Lymphatics/Throat:  Supple, no lymphadenopathy  Respiratory:  clear to auscultation bilaterally  Cardiovascular:   regular rate and rhythm  Gastrointestinal: soft, non-tender; bowel sounds normal; no masses,  no organomegaly. inguinal hernia noted.  small, reducible, left  Musculoskeletal: Steady gait and movement  Skin: Cool and moist  Psychiatric: Normal affect, non-agitated, not confused       LABS:  N/a   RADS: N/a Assessment:       Non-recurrent unilateral inguinal hernia without obstruction or gangrene [K40.90], LEFT  Plan:     1. Non-recurrent unilateral inguinal hernia without obstruction or gangrene [K40.90]   Discussed the risk of surgery including recurrence, which can be up to 50% in the case of incisional or complex hernias, possible use of prosthetic materials (mesh) and the increased risk of mesh infxn if used, bleeding, chronic pain, post-op infxn, post-op SBO or ileus, and possible re-operation to address said risks. The risks of general anesthetic, if used, includes MI, CVA, sudden death or even reaction to anesthetic medications also discussed. Alternatives include continued observation.  Benefits include possible symptom relief, prevention of incarceration, strangulation, enlargement in size over time, and the risk of emergency surgery in the face of strangulation.   Typical post-op recovery time of 3-5 days with 2 weeks of activity restrictions were also discussed.  ED return precautions given for sudden increase in pain, size of hernia with accompanying fever, nausea, and/or vomiting.  The patient verbalized understanding and all questions were answered to the patient's satisfaction.   2. Patient has elected to proceed with surgical treatment. Procedure will be scheduled. left, robotic assisted laparoscopic.  We did discuss pro/cons of open vs  robotic assisted laparoscopic, and patient and wife now comfortable proceeding with robotic assisted system.  labs/images/medications/previous chart entries reviewed personally and relevant changes/updates noted above.

## 2022-01-17 ENCOUNTER — Encounter
Admission: RE | Admit: 2022-01-17 | Discharge: 2022-01-17 | Disposition: A | Discharge status: Home / Self Care | Payer: Medicare Other | Source: Ambulatory Visit | Attending: Surgery | Admitting: Surgery

## 2022-01-17 HISTORY — DX: Sleep apnea, unspecified: G47.30

## 2022-01-17 HISTORY — DX: Occlusion and stenosis of bilateral carotid arteries: I65.23

## 2022-01-17 NOTE — Patient Instructions (Signed)
Your procedure is scheduled on:01-24-22 Friday Report to the Registration Desk on the 1st floor of the [facility].Then proceed to the 2nd floor Surgery Desk To find out your arrival time, please call [phone] between 1PM - 3PM on:01-23-22 Thursday If your arrival time is 6:00 am, do not arrive prior to that time as the [facility] entrance doors do not open until 6:00 am.  REMEMBER: Instructions that are not followed completely may result in serious medical risk, up to and including death; or upon the discretion of your surgeon and anesthesiologist your surgery may need to be rescheduled.  Do not eat food after midnight the night before surgery.  No gum chewing, lozengers or hard candies.  You may however, drink CLEAR liquids up to 2 hours before you are scheduled to arrive for your surgery. Do not drink anything within 2 hours of your scheduled arrival time.  Clear liquids include: - water  - apple juice without pulp - gatorade (not RED colors) - black coffee or tea (Do NOT add milk or creamers to the coffee or tea) Do NOT drink anything that is not on this list.  Do NOT take any medication the day of surgery  One week prior to surgery: Stop Anti-inflammatories (NSAIDS) such as Advil, Aleve, Ibuprofen, Motrin, Naproxen, Naprosyn and Aspirin based products such as Excedrin, Goodys Powder, BC Powder.You may however, continue to take Tylenol if needed for pain up until the day of surgery.  Stop ANY OVER THE COUNTER supplements/vitamins NOW (01-17-22) until after surgery (Multivitamin, B12 and Osteo-Biflex)  No Alcohol for 24 hours before or after surgery.  No Smoking including e-cigarettes for 24 hours prior to surgery.  No chewable tobacco products for at least 6 hours prior to surgery.  No nicotine patches on the day of surgery.  Do not use any "recreational" drugs for at least a week prior to your surgery.  Please be advised that the combination of cocaine and anesthesia  may have negative outcomes, up to and including death. If you test positive for cocaine, your surgery will be cancelled.  On the morning of surgery brush your teeth with toothpaste and water, you may rinse your mouth with mouthwash if you wish. Do not swallow any toothpaste or mouthwash.  Use CHG Soap as directed on instruction sheet.  Do not wear jewelry, make-up, hairpins, clips or nail polish.  Do not wear lotions, powders, or perfumes.   Do not shave body from the neck down 48 hours prior to surgery just in case you cut yourself which could leave a site for infection.  Also, freshly shaved skin may become irritated if using the CHG soap.  Contact lenses, hearing aids and dentures may not be worn into surgery.  Do not bring valuables to the hospital. [facility] is not responsible for any missing/lost belongings or valuables.   Notify your doctor if there is any change in your medical condition (cold, fever, infection).  Wear comfortable clothing (specific to your surgery type) to the hospital.  After surgery, you can help prevent lung complications by doing breathing exercises.  Take deep breaths and cough every 1-2 hours. Your doctor may order a device called an Incentive Spirometer to help you take deep breaths. When coughing or sneezing, hold a pillow firmly against your incision with both hands. This is called "splinting." Doing this helps protect your incision. It also decreases belly discomfort.  If you are being admitted to the hospital overnight, leave your suitcase in the  car. After surgery it may be brought to your room.  If you are being discharged the day of surgery, you will not be allowed to drive home. You will need a responsible adult (18 years or older) to drive you home and stay with you that night.   If you are taking public transportation, you will need to have a responsible adult (18 years or older) with you. Please confirm with your physician that it is  acceptable to use public transportation.   Please call the [facility] at [phone] if you have any questions about these instructions.  Surgery Visitation Policy:  Patients undergoing a surgery or procedure may have two family members or support persons with them as long as the person is not COVID-19 positive or experiencing its symptoms.   Due to an increase in RSV and influenza rates and associated hospitalizations, children ages 66 and under will not be able to visit patients in [facility]. Masks continue to be strongly recommended.

## 2022-01-24 ENCOUNTER — Ambulatory Visit: Payer: Medicare Other | Admitting: Certified Registered Nurse Anesthetist

## 2022-01-24 ENCOUNTER — Other Ambulatory Visit: Payer: Self-pay

## 2022-01-24 ENCOUNTER — Encounter: Payer: Self-pay | Admitting: Surgery

## 2022-01-24 ENCOUNTER — Ambulatory Visit
Admission: RE | Admit: 2022-01-24 | Discharge: 2022-01-24 | Disposition: A | Discharge status: Home / Self Care | Payer: Medicare Other | Attending: Surgery | Admitting: Surgery

## 2022-01-24 ENCOUNTER — Encounter
Admission: RE | Disposition: A | Discharge status: Home / Self Care | Payer: Self-pay | Source: Home / Self Care | Attending: Surgery

## 2022-01-24 DIAGNOSIS — K4090 Unilateral inguinal hernia, without obstruction or gangrene, not specified as recurrent: Secondary | ICD-10-CM | POA: Insufficient documentation

## 2022-01-24 DIAGNOSIS — G4733 Obstructive sleep apnea (adult) (pediatric): Secondary | ICD-10-CM | POA: Diagnosis not present

## 2022-01-24 HISTORY — PX: INSERTION OF MESH: SHX5868

## 2022-01-24 SURGERY — HERNIORRHAPHY, INGUINAL, ROBOT-ASSISTED, LAPAROSCOPIC
Anesthesia: General | Site: Groin | Laterality: Left

## 2022-01-24 MED ORDER — CHLORHEXIDINE GLUCONATE 0.12 % MT SOLN: 15.0000 mL | Freq: Once | OROMUCOSAL | Status: AC

## 2022-01-24 MED ORDER — FENTANYL CITRATE (PF) 100 MCG/2ML IJ SOLN
INTRAMUSCULAR | Status: AC
  Filled 2022-01-24: qty 2

## 2022-01-24 MED ORDER — FAMOTIDINE 20 MG PO TABS: 20.0000 mg | ORAL_TABLET | Freq: Once | ORAL | Status: AC

## 2022-01-24 MED ORDER — ACETAMINOPHEN 500 MG PO TABS
ORAL_TABLET | ORAL | Status: AC
  Administered 2022-01-24: 1000 mg via ORAL
  Filled 2022-01-24: qty 2

## 2022-01-24 MED ORDER — MIDAZOLAM HCL 2 MG/2ML IJ SOLN
INTRAMUSCULAR | Status: AC
  Filled 2022-01-24: qty 2

## 2022-01-24 MED ORDER — PROPOFOL 10 MG/ML IV BOLUS
INTRAVENOUS | Status: AC
  Filled 2022-01-24: qty 40

## 2022-01-24 MED ORDER — CEFAZOLIN SODIUM-DEXTROSE 2-4 GM/100ML-% IV SOLN
2.0000 g | INTRAVENOUS | Status: AC
  Administered 2022-01-24: 2 g via INTRAVENOUS

## 2022-01-24 MED ORDER — CHLORHEXIDINE GLUCONATE 0.12 % MT SOLN
OROMUCOSAL | Status: AC
  Administered 2022-01-24: 15 mL via OROMUCOSAL
  Filled 2022-01-24: qty 15

## 2022-01-24 MED ORDER — FENTANYL CITRATE (PF) 100 MCG/2ML IJ SOLN
25.0000 ug | INTRAMUSCULAR | Status: DC | PRN
  Administered 2022-01-24 (×4): 25 ug via INTRAVENOUS

## 2022-01-24 MED ORDER — CELECOXIB 200 MG PO CAPS
ORAL_CAPSULE | ORAL | Status: AC
  Administered 2022-01-24: 200 mg via ORAL
  Filled 2022-01-24: qty 1

## 2022-01-24 MED ORDER — ROCURONIUM BROMIDE 10 MG/ML (PF) SYRINGE
PREFILLED_SYRINGE | INTRAVENOUS | Status: AC
  Filled 2022-01-24: qty 10

## 2022-01-24 MED ORDER — DOCUSATE SODIUM 100 MG PO CAPS: 100.0000 mg | ORAL_CAPSULE | Freq: Two times a day (BID) | ORAL | 0 refills | Status: AC | PRN

## 2022-01-24 MED ORDER — ONDANSETRON HCL 4 MG/2ML IJ SOLN
INTRAMUSCULAR | Status: AC
  Filled 2022-01-24: qty 2

## 2022-01-24 MED ORDER — GABAPENTIN 300 MG PO CAPS
ORAL_CAPSULE | ORAL | Status: AC
  Administered 2022-01-24: 300 mg via ORAL
  Filled 2022-01-24: qty 1

## 2022-01-24 MED ORDER — HYDROCODONE-ACETAMINOPHEN 5-325 MG PO TABS: 1.0000 | ORAL_TABLET | Freq: Four times a day (QID) | ORAL | 0 refills | Status: DC | PRN

## 2022-01-24 MED ORDER — ROCURONIUM BROMIDE 100 MG/10ML IV SOLN
INTRAVENOUS | Status: DC | PRN
  Administered 2022-01-24: 20 mg via INTRAVENOUS
  Administered 2022-01-24: 10 mg via INTRAVENOUS
  Administered 2022-01-24: 40 mg via INTRAVENOUS

## 2022-01-24 MED ORDER — LIDOCAINE HCL (PF) 2 % IJ SOLN
INTRAMUSCULAR | Status: AC
  Filled 2022-01-24: qty 5

## 2022-01-24 MED ORDER — CHLORHEXIDINE GLUCONATE CLOTH 2 % EX PADS
6.0000 | MEDICATED_PAD | Freq: Once | CUTANEOUS | Status: AC
  Administered 2022-01-24: 6 via TOPICAL

## 2022-01-24 MED ORDER — LACTATED RINGERS IV SOLN: INTRAVENOUS | Status: DC

## 2022-01-24 MED ORDER — BUPIVACAINE-EPINEPHRINE (PF) 0.5% -1:200000 IJ SOLN
INTRAMUSCULAR | Status: AC
  Filled 2022-01-24: qty 30

## 2022-01-24 MED ORDER — OXYCODONE HCL 5 MG PO TABS
ORAL_TABLET | ORAL | Status: AC
  Filled 2022-01-24: qty 1

## 2022-01-24 MED ORDER — ACETAMINOPHEN 500 MG PO TABS: 1000.0000 mg | ORAL_TABLET | ORAL | Status: AC

## 2022-01-24 MED ORDER — LIDOCAINE HCL (CARDIAC) PF 100 MG/5ML IV SOSY
PREFILLED_SYRINGE | INTRAVENOUS | Status: DC | PRN
  Administered 2022-01-24: 60 mg via INTRAVENOUS

## 2022-01-24 MED ORDER — CELECOXIB 200 MG PO CAPS: 200.0000 mg | ORAL_CAPSULE | ORAL | Status: AC

## 2022-01-24 MED ORDER — PROPOFOL 10 MG/ML IV BOLUS
INTRAVENOUS | Status: DC | PRN
  Administered 2022-01-24: 120 mg via INTRAVENOUS

## 2022-01-24 MED ORDER — GABAPENTIN 300 MG PO CAPS: 300.0000 mg | ORAL_CAPSULE | ORAL | Status: AC

## 2022-01-24 MED ORDER — ONDANSETRON HCL 4 MG/2ML IJ SOLN
INTRAMUSCULAR | Status: DC | PRN
  Administered 2022-01-24: 4 mg via INTRAVENOUS

## 2022-01-24 MED ORDER — FAMOTIDINE 20 MG PO TABS
ORAL_TABLET | ORAL | Status: AC
  Administered 2022-01-24: 20 mg via ORAL
  Filled 2022-01-24: qty 1

## 2022-01-24 MED ORDER — BUPIVACAINE LIPOSOME 1.3 % IJ SUSP
INTRAMUSCULAR | Status: AC
  Filled 2022-01-24: qty 20

## 2022-01-24 MED ORDER — BUPIVACAINE-EPINEPHRINE 0.5% -1:200000 IJ SOLN
INTRAMUSCULAR | Status: DC | PRN
  Administered 2022-01-24: 15 mL

## 2022-01-24 MED ORDER — SUGAMMADEX SODIUM 200 MG/2ML IV SOLN
INTRAVENOUS | Status: DC | PRN
  Administered 2022-01-24: 128 mg via INTRAVENOUS

## 2022-01-24 MED ORDER — DEXAMETHASONE SODIUM PHOSPHATE 10 MG/ML IJ SOLN
INTRAMUSCULAR | Status: AC
  Filled 2022-01-24: qty 1

## 2022-01-24 MED ORDER — OXYCODONE HCL 5 MG PO TABS
5.0000 mg | ORAL_TABLET | Freq: Once | ORAL | Status: AC
  Administered 2022-01-24: 5 mg via ORAL

## 2022-01-24 MED ORDER — MIDAZOLAM HCL 2 MG/2ML IJ SOLN
INTRAMUSCULAR | Status: DC | PRN
  Administered 2022-01-24: 1 mg via INTRAVENOUS

## 2022-01-24 MED ORDER — FENTANYL CITRATE (PF) 100 MCG/2ML IJ SOLN
INTRAMUSCULAR | Status: DC | PRN
  Administered 2022-01-24 (×2): 50 ug via INTRAVENOUS

## 2022-01-24 MED ORDER — BUPIVACAINE LIPOSOME 1.3 % IJ SUSP
INTRAMUSCULAR | Status: DC | PRN
  Administered 2022-01-24: 20 mL

## 2022-01-24 MED ORDER — ONDANSETRON HCL 4 MG/2ML IJ SOLN: 4.0000 mg | Freq: Once | INTRAMUSCULAR | Status: DC | PRN

## 2022-01-24 MED ORDER — ACETAMINOPHEN 325 MG PO TABS: 650.0000 mg | ORAL_TABLET | Freq: Three times a day (TID) | ORAL | 0 refills | Status: AC | PRN

## 2022-01-24 MED ORDER — CEFAZOLIN SODIUM-DEXTROSE 2-4 GM/100ML-% IV SOLN
INTRAVENOUS | Status: AC
  Filled 2022-01-24: qty 100

## 2022-01-24 MED ORDER — ORAL CARE MOUTH RINSE: 15.0000 mL | Freq: Once | OROMUCOSAL | Status: AC

## 2022-01-24 MED ORDER — DEXAMETHASONE SODIUM PHOSPHATE 10 MG/ML IJ SOLN
INTRAMUSCULAR | Status: DC | PRN
  Administered 2022-01-24: 10 mg via INTRAVENOUS

## 2022-01-24 SURGICAL SUPPLY — 51 items
ADH SKN CLS APL DERMABOND .7 (GAUZE/BANDAGES/DRESSINGS) ×2
BAG PRESSURE INF REUSE 1000 (BAG) IMPLANT
BLADE SURG SZ11 CARB STEEL (BLADE) ×2 IMPLANT
BNDG GAUZE DERMACEA FLUFF 4 (GAUZE/BANDAGES/DRESSINGS) ×2 IMPLANT
BNDG GZE DERMACEA 4 6PLY (GAUZE/BANDAGES/DRESSINGS) ×2
COVER TIP SHEARS 8 DVNC (MISCELLANEOUS) ×2 IMPLANT
COVER TIP SHEARS 8MM DA VINCI (MISCELLANEOUS) ×2
COVER WAND RF STERILE (DRAPES) ×2 IMPLANT
DERMABOND ADVANCED .7 DNX12 (GAUZE/BANDAGES/DRESSINGS) ×2 IMPLANT
DRAPE ARM DVNC X/XI (DISPOSABLE) ×6 IMPLANT
DRAPE COLUMN DVNC XI (DISPOSABLE) ×2 IMPLANT
DRAPE DA VINCI XI ARM (DISPOSABLE) ×6
DRAPE DA VINCI XI COLUMN (DISPOSABLE) ×2
ELECT CAUTERY BLADE 6.4 (BLADE) IMPLANT
ELECT REM PT RETURN 9FT ADLT (ELECTROSURGICAL) ×2
ELECTRODE REM PT RTRN 9FT ADLT (ELECTROSURGICAL) ×2 IMPLANT
GLOVE BIOGEL PI IND STRL 7.0 (GLOVE) ×4 IMPLANT
GLOVE SURG SYN 6.5 ES PF (GLOVE) ×6 IMPLANT
GLOVE SURG SYN 6.5 PF PI (GLOVE) ×4 IMPLANT
GOWN STRL REUS W/ TWL LRG LVL3 (GOWN DISPOSABLE) ×6 IMPLANT
GOWN STRL REUS W/TWL LRG LVL3 (GOWN DISPOSABLE) ×6
IRRIGATOR SUCT 8 DISP DVNC XI (IRRIGATION / IRRIGATOR) IMPLANT
IRRIGATOR SUCTION 8MM XI DISP (IRRIGATION / IRRIGATOR)
IV NS 1000ML (IV SOLUTION)
IV NS 1000ML BAXH (IV SOLUTION) IMPLANT
LABEL OR SOLS (LABEL) IMPLANT
MANIFOLD NEPTUNE II (INSTRUMENTS) ×2 IMPLANT
MESH 3DMAX MID 4X6 LT LRG (Mesh General) IMPLANT
NDL INSUFFLATION 14GA 120MM (NEEDLE) ×2 IMPLANT
NEEDLE HYPO 22GX1.5 SAFETY (NEEDLE) ×2 IMPLANT
NEEDLE INSUFFLATION 14GA 120MM (NEEDLE) ×2 IMPLANT
OBTURATOR OPTICAL STANDARD 8MM (TROCAR) ×2
OBTURATOR OPTICAL STND 8 DVNC (TROCAR) ×2
OBTURATOR OPTICALSTD 8 DVNC (TROCAR) ×2 IMPLANT
PACK LAP CHOLECYSTECTOMY (MISCELLANEOUS) ×2 IMPLANT
PENCIL SMOKE EVACUATOR (MISCELLANEOUS) ×2 IMPLANT
SEAL CANN UNIV 5-8 DVNC XI (MISCELLANEOUS) ×6 IMPLANT
SEAL XI 5MM-8MM UNIVERSAL (MISCELLANEOUS) ×6
SET TUBE SMOKE EVAC HIGH FLOW (TUBING) ×2 IMPLANT
SOLUTION ELECTROLUBE (MISCELLANEOUS) ×2 IMPLANT
SPONGE T-LAP 4X18 ~~LOC~~+RFID (SPONGE) IMPLANT
SUT MNCRL 4-0 (SUTURE) ×2
SUT MNCRL 4-0 27XMFL (SUTURE) ×2
SUT VIC AB 2-0 SH 27 (SUTURE) ×2
SUT VIC AB 2-0 SH 27XBRD (SUTURE) ×2 IMPLANT
SUT VLOC 90 6 CV-15 VIOLET (SUTURE) ×4 IMPLANT
SUTURE MNCRL 4-0 27XMF (SUTURE) ×2 IMPLANT
SYR 30ML LL (SYRINGE) ×2 IMPLANT
TAPE TRANSPORE STRL 2 31045 (GAUZE/BANDAGES/DRESSINGS) ×2 IMPLANT
TRAP FLUID SMOKE EVACUATOR (MISCELLANEOUS) ×2 IMPLANT
WATER STERILE IRR 500ML POUR (IV SOLUTION) ×2 IMPLANT

## 2022-01-24 NOTE — Anesthesia Postprocedure Evaluation (Signed)
Anesthesia Post Note  Patient: Rodney Schmidt  Procedure(s) Performed: XI ROBOTIC ASSISTED INGUINAL HERNIA (Left: Groin) INSERTION OF MESH  Patient location during evaluation: PACU Anesthesia Type: General Level of consciousness: awake and alert Pain management: pain level controlled Vital Signs Assessment: post-procedure vital signs reviewed and stable Respiratory status: spontaneous breathing, nonlabored ventilation, respiratory function stable and patient connected to nasal cannula oxygen Cardiovascular status: blood pressure returned to baseline and stable Postop Assessment: no apparent nausea or vomiting Anesthetic complications: no   No notable events documented.   Last Vitals:  Vitals:   01/24/22 0625 01/24/22 0933  BP: 130/72 (!) 160/69  Pulse: 65 66  Resp: 18 16  Temp: 36.5 C 36.5 C  SpO2: 95% 99%    Last Pain:  Vitals:   01/24/22 0933  TempSrc:   PainSc: [place] [employer] [place]

## 2022-01-24 NOTE — H&P (Signed)
Subjective:  CC: Non-recurrent unilateral inguinal hernia without obstruction or gangrene [K40.90]  HPI: Rodney Schmidt is a 76 y.o. male who returns for above. No acute changes since last exam.  Past Medical History: has a past medical history of Anemia (09/26/2013), Benign neoplasm of colon, Cataract cortical, senile (1994), OSA (obstructive sleep apnea) (09/04/2020), Osteoarthritis, Other and unspecified hyperlipidemia (09/26/2013), and Strabismus.  Past Surgical History: Past Surgical History: Procedure Laterality Date CATARACT EXTRACTION Bilateral 1993 bilateral COLONOSCOPY 06/01/2008 hyperplastic polyp, internal hemorrhoids, repeat 10 years (RTE) CBF 05/2018: Recall ltr mailed S/P shunt placement History of borderline intraocular pressures STRABISMUS EYE SURGERY multiple TONSILLECTOMY urolift  Family History: family history includes Alcohol abuse in his father; Alzheimer's disease in his mother; Cirrhosis in his father; Glaucoma in his mother; Prostate cancer in his brother and brother.  Social History: reports that he quit smoking about 47 years ago. His smoking use included cigarettes. He has never used smokeless tobacco. He reports that he does not drink alcohol and does not use drugs.  Current Medications: has a current medication list which includes the following prescription(s): cyanocobalamin (vitamin b-12), glucosamine/chondr su a sod, multivitamin, turmeric, apoaequorin, and sildenafil.  Allergies: Allergies as of 12/24/2021 (No Known Allergies)  ROS: A 15 point review of systems was performed and pertinent positives and negatives noted in HPI  Objective:   BP 128/62  Pulse 69  Ht 165.1 cm ('5\' 5"'$ )  Wt 63.5 kg (140 lb)  BMI 23.30 kg/m  Constitutional : Alert, cooperative, no distress Lymphatics/Throat: Supple, no lymphadenopathy Respiratory: clear to auscultation bilaterally Cardiovascular: regular rate and rhythm Gastrointestinal: soft, non-tender; bowel  sounds normal; no masses, no organomegaly. inguinal hernia noted. small, reducible, left Musculoskeletal: Steady gait and movement Skin: Cool and moist Psychiatric: Normal affect, non-agitated, not confused   LABS: N/a  RADS: N/a Assessment:   Non-recurrent unilateral inguinal hernia without obstruction or gangrene [K40.90], LEFT  Plan:   1. Non-recurrent unilateral inguinal hernia without obstruction or gangrene [K40.90] Discussed the risk of surgery including recurrence, which can be up to 50% in the case of incisional or complex hernias, possible use of prosthetic materials (mesh) and the increased risk of mesh infxn if used, bleeding, chronic pain, post-op infxn, post-op SBO or ileus, and possible re-operation to address said risks. The risks of general anesthetic, if used, includes MI, CVA, sudden death or even reaction to anesthetic medications also discussed. Alternatives include continued observation. Benefits include possible symptom relief, prevention of incarceration, strangulation, enlargement in size over time, and the risk of emergency surgery in the face of strangulation.  Typical post-op recovery time of 3-5 days with 2 weeks of activity restrictions were also discussed.  ED return precautions given for sudden increase in pain, size of hernia with accompanying fever, nausea, and/or vomiting.  The patient verbalized understanding and all questions were answered to the patient's satisfaction.  2. Patient has elected to proceed with surgical treatment. Procedure will be scheduled. left, robotic assisted laparoscopic. We did discuss pro/cons of open vs robotic assisted laparoscopic, and patient and wife now comfortable proceeding with robotic assisted system.  labs/images/medications/previous chart entries reviewed personally and relevant changes/updates noted above.

## 2022-01-24 NOTE — Interval H&P Note (Signed)
History and Physical Interval Note:  01/24/2022 7:10 AM  Rodney Schmidt  has presented today for surgery, with the diagnosis of Non-recurrent unilateral inguinal hernia without obstruction or gangrene K40.90.  The various methods of treatment have been discussed with the patient and family. After consideration of risks, benefits and other options for treatment, the patient has consented to  Procedure(s): XI ROBOTIC Gay (Left) as a surgical intervention.  The patient's history has been reviewed, patient examined, no change in status, stable for surgery.  I have reviewed the patient's chart and labs.  Questions were answered to the patient's satisfaction.     Rodney Schmidt Lysle Pearl

## 2022-01-24 NOTE — Discharge Instructions (Addendum)
Hernia repair, Care After This sheet gives you information about how to care for yourself after your procedure. Your health care provider may also give you more specific instructions. If you have problems or questions, contact your health care provider. What can I expect after the procedure? After your procedure, it is common to have the following: Pain in your abdomen, especially in the incision areas. You will be given medicine to control the pain. Tiredness. This is a normal part of the recovery process. Your energy level will return to normal over the next several weeks. Changes in your bowel movements, such as constipation or needing to go more often. Talk with your health care provider about how to manage this. Follow these instructions at home: Medicines  tylenol and advil as needed for discomfort.  Please alternate between the two every four hours as needed for pain.    Use narcotics, if prescribed, only when tylenol and motrin is not enough to control pain.  325-650mg every 8hrs to max of 3000mg/24hrs (including the 325mg in every norco dose) for the tylenol.    Advil up to 800mg per dose every 8hrs as needed for pain.   PLEASE RECORD NUMBER OF PILLS TAKEN UNTIL NEXT FOLLOW UP APPT.  THIS WILL HELP DETERMINE HOW READY YOU ARE TO BE RELEASED FROM ANY ACTIVITY RESTRICTIONS Do not drive or use heavy machinery while taking prescription pain medicine. Do not drink alcohol while taking prescription pain medicine.  Incision care    Follow instructions from your health care provider about how to take care of your incision areas. Make sure you: Keep your incisions clean and dry. Wash your hands with soap and water before and after applying medicine to the areas, and before and after changing your bandage (dressing). If soap and water are not available, use hand sanitizer. Change your dressing as told by your health care provider. Leave stitches (sutures), skin glue, or adhesive strips in  place. These skin closures may need to stay in place for 2 weeks or longer. If adhesive strip edges start to loosen and curl up, you may trim the loose edges. Do not remove adhesive strips completely unless your health care provider tells you to do that. Do not wear tight clothing over the incisions. Tight clothing may rub and irritate the incision areas, which may cause the incisions to open. Do not take baths, swim, or use a hot tub until your health care provider approves. OK TO SHOWER IN 24HRS.   Check your incision area every day for signs of infection. Check for: More redness, swelling, or pain. More fluid or blood. Warmth. Pus or a bad smell. Activity Avoid lifting anything that is heavier than 10 lb (4.5 kg) for 2 weeks or until your health care provider says it is okay. No pushing/pulling greater than 30lbs You may resume normal activities as told by your health care provider. Ask your health care provider what activities are safe for you. Take rest breaks during the day as needed. Eating and drinking Follow instructions from your health care provider about what you can eat after surgery. To prevent or treat constipation while you are taking prescription pain medicine, your health care provider may recommend that you: Drink enough fluid to keep your urine clear or pale yellow. Take over-the-counter or prescription medicines. Eat foods that are high in fiber, such as fresh fruits and vegetables, whole grains, and beans. Limit foods that are high in fat and processed sugars, such as fried and   sweet foods. General instructions Ask your health care provider when you will need an appointment to get your sutures or staples removed. Keep all follow-up visits as told by your health care provider. This is important. Contact a health care provider if: You have more redness, swelling, or pain around your incisions. You have more fluid or blood coming from the incisions. Your incisions feel  warm to the touch. You have pus or a bad smell coming from your incisions or your dressing. You have a fever. You have an incision that breaks open (edges not staying together) after sutures or staples have been removed. You develop a rash. You have chest pain or difficulty breathing. You have pain or swelling in your legs. You feel light-headed or you faint. Your abdomen swells (becomes distended). You have nausea or vomiting. You have blood in your stool (feces). This information is not intended to replace advice given to you by your health care provider. Make sure you discuss any questions you have with your health care provider. Document Released: 08/02/2004 Document Revised: 10/02/2017 Document Reviewed: 10/15/2015 Elsevier Interactive Patient Education  2019 [place]   The drugs that you were given will stay in your system until tomorrow so for the next 24 hours you should not:  Drive an automobile Make any legal decisions Drink any alcoholic beverage   You may resume regular meals tomorrow.  Today it is better to start with liquids and gradually work up to solid foods.  You may eat anything you prefer, but it is better to start with liquids, then soup and crackers, and gradually work up to solid foods.   Please notify your doctor immediately if you have any unusual bleeding, trouble breathing, redness and pain at the surgery site, drainage, fever, or pain not relieved by medication.    Additional Instructions:  PLEASE LEAVE GREEN ARMBAND ON FOR 4 DAYS    Please contact your physician with any problems or Same Day Surgery at [phone], Monday through Friday 6 am to 4 pm, or [place] at [facility] number at [phone].

## 2022-01-24 NOTE — Anesthesia Preprocedure Evaluation (Signed)
Anesthesia Evaluation  Patient identified by MRN, date of birth, ID band Patient awake    Reviewed: Allergy & Precautions, H&P , NPO status , Patient's Chart, lab work & pertinent test results, reviewed documented beta blocker date and time   Airway Mallampati: II  TM Distance: >3 FB Neck ROM: full    Dental  (+) Teeth Intact   Pulmonary sleep apnea and Continuous Positive Airway Pressure Ventilation    Pulmonary exam normal        Cardiovascular Exercise Tolerance: Good negative cardio ROS Normal cardiovascular exam Rhythm:regular Rate:Normal     Neuro/Psych negative neurological ROS  negative psych ROS   GI/Hepatic negative GI ROS, Neg liver ROS,,,  Endo/Other  negative endocrine ROS    Renal/GU negative Renal ROS  negative genitourinary   Musculoskeletal   Abdominal   Peds  Hematology  (+) Blood dyscrasia, anemia   Anesthesia Other Findings Past Medical History: No date: Anemia No date: Benign neoplasm of colon No date: Bilateral carotid artery stenosis No date: BPH (benign prostatic hyperplasia) No date: HLD (hyperlipidemia) No date: Hx of strabismus No date: Osteoarthritis No date: Sleep apnea     Comment:  pt denies this but is in cardiology note Past Surgical History: 1993: CATARACT EXTRACTION, BILATERAL No date: COLONOSCOPY 05/25/2020: CYSTOSCOPY WITH INSERTION OF UROLIFT; N/A     Comment:  Procedure: CYSTOSCOPY WITH INSERTION OF UROLIFT;                Surgeon: Billey Co, MD;  Location: [facility] ORS;                Service: Urology;  Laterality: N/A; No date: STRABISMUS SURGERY     Comment:  x5-ages 2-14 No date: TONSILLECTOMY BMI    Body Mass Index: 24.20 kg/m     Reproductive/Obstetrics negative OB ROS                             Anesthesia Physical Anesthesia Plan  ASA: 3  Anesthesia Plan: General ETT   Post-op Pain Management:    Induction:   PONV  Risk Score and Plan: 3  Airway Management Planned:   Additional Equipment:   Intra-op Plan:   Post-operative Plan:   Informed Consent: I have reviewed the patients History and Physical, chart, labs and discussed the procedure including the risks, benefits and alternatives for the proposed anesthesia with the patient or authorized representative who has indicated his/her understanding and acceptance.     Dental Advisory Given  Plan Discussed with: CRNA  Anesthesia Plan Comments:        Anesthesia Quick Evaluation

## 2022-01-24 NOTE — Op Note (Signed)
Preoperative diagnosis: Left initial reducible inguinal Hernia.  Postoperative diagnosis: Left inguinal Hernia  Procedure: Robotic assisted laparoscopic left inguinal hernia repair with mesh  Anesthesia: General  Surgeon: Dr. Lysle Pearl  Wound Classification: Clean  Specimen: none  Complications: None  Estimated Blood Loss: 25m   Indications:  inguinal hernia. Repair was indicated to avoid complications of incarceration, obstruction and pain, and a prosthetic mesh repair was elected.  See H&P for further details.  Findings: 1. Vas Deferens and cord structures identified and preserved 2. Bard 3D max medium weight mesh used for repair 3. Adequate hemostasis achieved  Description of procedure: The patient was taken to the operating room. A time-out was completed verifying correct patient, procedure, site, positioning, and implant(s) and/or special equipment prior to beginning this procedure.  Area was prepped and draped in the usual sterile fashion. An incision was marked 20 cm above the pubic tubercle, slightly above the umbilicus  Scrotum wrapped in Kerlix roll.  Veress needle inserted at palmer's point.  Saline drop test noted to be positive with gradual increase in pressure after initiation of gas insufflation.  15 mm of pressure was achieved prior to removing the Veress needle and then placing a 8 mm port via the Optiview technique through the supraumbilical site.  Inspection of the area afterwards noted no injury to the surrounding organs during insertion of the needle and the port.  2 port sites were marked 8 cm to the lateral sides of the initial port, and a 8 mm robotic port was placed on the left side, another 8 mm robotic port on the right side under direct supervision.  Local anesthesia  infused to the preplanned incision sites prior to insertion of the port.  The [place] then brought into the operative field and docked to the ports.  Examination of the abdominal  cavity noted a left inguinal hernia.  A peritoneal flap was created approximately 8cm cephalad to the defect by using scissors with electrocautery.  Dissection was carried down towards the pubic tubercle, developing the myopectineal orifice view.  Laterally the flap was carried towards the ASIS.  Moderate size hernia sac was noted, which carefully dissected away from the adjacent tissues to be fully reduced out of hernia cavity.  Any bleeding was controlled with combination of electrocautery and manual pressure.    After confirming adequate dissection and the peritoneal reflection completely down and away from the cord structures, a Large Bard 3DMax medium weight mesh was placed within the anterior abdominal wall, secured in place using 2-0 Vicryl on an SH needle immediately above the pubic tubercle.  After noting proper placement of the mesh with the peritoneal reflection deep to it, the previously created peritoneal flap was secured back up to the anterior abdominal wall using running 3-0 V-Lock.  Both needles were then removed out of the abdominal cavity, Xi platform undocked from the ports and removed off of operative field.  exparel infused as ilioinguinal block.  Abdomen then desufflated and ports removed. All the skin incisions were then closed with a subcuticular stitch of Monocryl 4-0. Dermabond was applied. The testis was gently pulled down into its anatomic position in the scrotum.  The patient tolerated the procedure well and was taken to the postanesthesia care unit in stable condition. Sponge and instrument count correct at end of procedure.

## 2022-01-24 NOTE — Transfer of Care (Signed)
Immediate Anesthesia Transfer of Care Note  Patient: Rodney Schmidt  Procedure(s) Performed: XI ROBOTIC ASSISTED INGUINAL HERNIA (Left: Groin) INSERTION OF MESH  Patient Location: PACU  Anesthesia Type:General  Level of Consciousness: awake, alert , and oriented  Airway & Oxygen Therapy: Patient Spontanous Breathing and Patient connected to face mask oxygen  Post-op Assessment: Report given to RN and Post -op Vital signs reviewed and stable  Post vital signs: Reviewed and stable  Last Vitals:  Vitals Value Taken Time  BP    Temp    Pulse 66 01/24/22 0933  Resp    SpO2 99 % 01/24/22 0933  Vitals shown include unvalidated device data.  Last Pain:  Vitals:   01/24/22 0625  TempSrc: Temporal  PainSc: 0-No pain         Complications: No notable events documented.

## 2022-01-24 NOTE — Anesthesia Procedure Notes (Signed)
Procedure Name: Intubation Date/Time: 01/24/2022 7:40 AM  Performed by: Demetrius Charity, CRNAPre-anesthesia Checklist: Patient identified, Patient being monitored, Timeout performed, Emergency Drugs available and Suction available Patient Re-evaluated:Patient Re-evaluated prior to induction Oxygen Delivery Method: Circle system utilized Preoxygenation: Pre-oxygenation with 100% oxygen Induction Type: IV induction Ventilation: Mask ventilation without difficulty Laryngoscope Size: McGraph and 4 Grade View: Grade II Tube type: Oral Tube size: 7.0 mm Number of attempts: 1 Airway Equipment and Method: Stylet and Video-laryngoscopy Placement Confirmation: ETT inserted through vocal cords under direct vision, positive ETCO2 and breath sounds checked- equal and bilateral Secured at: 24 cm Tube secured with: Tape Dental Injury: Teeth and Oropharynx as per pre-operative assessment

## 2022-01-26 ENCOUNTER — Encounter: Payer: Self-pay | Admitting: Surgery

## 2022-04-30 ENCOUNTER — Emergency Department
Admission: EM | Admit: 2022-04-30 | Discharge: 2022-04-30 | Disposition: A | Discharge status: Home / Self Care | Payer: Medicare Other | Attending: Emergency Medicine | Admitting: Emergency Medicine

## 2022-04-30 ENCOUNTER — Emergency Department: Payer: Medicare Other

## 2022-04-30 ENCOUNTER — Other Ambulatory Visit: Payer: Self-pay

## 2022-04-30 DIAGNOSIS — M5441 Lumbago with sciatica, right side: Secondary | ICD-10-CM | POA: Diagnosis not present

## 2022-04-30 DIAGNOSIS — M5431 Sciatica, right side: Secondary | ICD-10-CM

## 2022-04-30 DIAGNOSIS — M545 Low back pain, unspecified: Secondary | ICD-10-CM | POA: Diagnosis present

## 2022-04-30 DIAGNOSIS — S39012A Strain of muscle, fascia and tendon of lower back, initial encounter: Secondary | ICD-10-CM | POA: Diagnosis not present

## 2022-04-30 DIAGNOSIS — X58XXXA Exposure to other specified factors, initial encounter: Secondary | ICD-10-CM | POA: Diagnosis not present

## 2022-04-30 MED ORDER — TRAMADOL HCL 50 MG PO TABS: 50.0000 mg | ORAL_TABLET | Freq: Three times a day (TID) | ORAL | 0 refills | Status: AC | PRN

## 2022-04-30 MED ORDER — NAPROXEN 500 MG PO TABS: 500.0000 mg | ORAL_TABLET | Freq: Two times a day (BID) | ORAL | 0 refills | Status: AC

## 2022-04-30 MED ORDER — CYCLOBENZAPRINE HCL 5 MG PO TABS: 5.0000 mg | ORAL_TABLET | Freq: Three times a day (TID) | ORAL | 0 refills | Status: AC | PRN

## 2022-04-30 NOTE — ED Provider Notes (Signed)
[facility] Emergency Department Provider Note     Event Date/Time   First MD Initiated Contact with Patient 04/30/22 1122     (approximate)   History   Back Pain   HPI  Rodney Schmidt is a 77 y.o. male with a history of BPH, HLD, and osteoarthritis, presents to the ED for evaluation of low back pain.  Patient reports right-sided low back pain with referral down the right leg.  He denies any recent injury, trauma, or fall.  He also denies any bladder or bowel incontinence, foot drop, saddle anesthesias.     Physical Exam   Triage Vital Signs: ED Triage Vitals [04/30/22 1026]  Enc Vitals Group     BP (!) 143/67     Pulse Rate 63     Resp 18     Temp 98 F (36.7 C)     Temp Source Oral     SpO2 95 %     Weight      Height      Head Circumference      Peak Flow      Pain Score 8     Pain Loc      Pain Edu?      Excl. in [place]?     Most recent vital signs: Vitals:   04/30/22 1026  BP: (!) 143/67  Pulse: 63  Resp: 18  Temp: 98 F (36.7 C)  SpO2: 95%    General Awake, no distress. NAd CV:  Good peripheral perfusion.  RESP:  Normal effort.  ABD:  No distention.  MSK:  Spinal alignment without midline tenderness, spasm, deformity, or step-off.  Normal transition from sit to stand. NEURO: Cranial nerves II to XII grossly intact.  Normal LE DTRs bilaterally.  ED Results / Procedures / Treatments   Labs (all labs ordered are listed, but only abnormal results are displayed) Labs Reviewed - No data to display  EKG    RADIOLOGY  I personally viewed and evaluated these images as part of my medical decision making, as well as reviewing the written report by the radiologist.  ED Provider Interpretation: No Acute findings.  Evidence of DDD and facet arthropathy noted  DG Lumbar Spine Complete  Result Date: 04/30/2022 CLINICAL DATA:  Low back pain and right lower extremity pain EXAM: LUMBAR SPINE - COMPLETE 4+ VIEW COMPARISON:   None Available. FINDINGS: Five lumbar type vertebral bodies. Mild scoliotic curvature convex to the right. 2 mm degenerative anterolisthesis at L5-S1. Disc space narrowing from L2-3 through L5-S1. Lower lumbar facet osteoarthritis. Incidental note is made of regional arterial calcification and clips in the prostate bed region. IMPRESSION: Mild scoliotic curvature convex to the right. 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 Lower lumbar facet osteoarthritis. Electronically Signed   By: Nelson Chimes M.D.   On: 04/30/2022 12:28     PROCEDURES:  Critical Care performed: No  Procedures   MEDICATIONS ORDERED IN ED: Medications - No data to display   IMPRESSION / MDM / [place] / ED COURSE  I reviewed the triage vital signs and the nursing notes.                              Differential diagnosis includes, but is not limited to, sciatica, piriformis syndrome, lumbar radiculopathy, lumbar DDD  Patient's presentation is most consistent with acute complicated illness / injury requiring diagnostic workup.  Patient to  the ED for evaluation of right lower extremity referral pain with some mild sciatic nerve irritation.  Reassuring exam and x-ray overall.  Showing no acute lumbar compression fractures.  No red flags on exam.  Patient's diagnosis is consistent with static nerve irritation and lumbar DDD. Patient will be discharged home with prescriptions for Qroxin, Flexeril, and Ultram. Patient is to follow up with his primary provider as needed or otherwise directed. Patient is given ED precautions to return to the ED for any worsening or new symptoms.  FINAL CLINICAL IMPRESSION(S) / ED DIAGNOSES   Final diagnoses:  Sciatica of right side  Strain of lumbar region, initial encounter     Rx / DC Orders   ED Discharge Orders          Ordered    naproxen (NAPROSYN) 500 MG tablet  2 times daily with meals        04/30/22 1243    traMADol (ULTRAM) 50 MG tablet  3 times  daily PRN        04/30/22 1243    cyclobenzaprine (FLEXERIL) 5 MG tablet  3 times daily PRN        04/30/22 1243             Note:  This document was prepared using Dragon voice recognition software and may include unintentional dictation errors.    Melvenia Needles, PA-C 04/30/22 1246    Carrie Mew, MD 04/30/22 [phone]

## 2022-04-30 NOTE — ED Triage Notes (Signed)
Pt to ED via POV from home. Pt reports right lower back pain that radiates down right leg that has gotten worse. Pt denies hx of sciatica.

## 2022-04-30 NOTE — Discharge Instructions (Addendum)
Your exam and x-rays are reassuring at this time without any signs of severe spinal cord injury.  You do have clinical symptoms consistent with likely sciatic nerve irritation.  Take prescription muscle relaxant and anti-inflammatory as directed.  Follow-up with primary provider for ongoing symptom management.

## 2022-05-23 ENCOUNTER — Emergency Department
Admission: EM | Admit: 2022-05-23 | Discharge: 2022-05-23 | Disposition: A | Discharge status: Home / Self Care | Payer: Medicare Other | Attending: Emergency Medicine | Admitting: Emergency Medicine

## 2022-05-23 ENCOUNTER — Other Ambulatory Visit: Payer: Self-pay

## 2022-05-23 DIAGNOSIS — M5431 Sciatica, right side: Secondary | ICD-10-CM | POA: Diagnosis not present

## 2022-05-23 DIAGNOSIS — M79604 Pain in right leg: Secondary | ICD-10-CM | POA: Diagnosis present

## 2022-05-23 MED ORDER — PREDNISONE 10 MG (21) PO TBPK: ORAL_TABLET | ORAL | 0 refills | Status: AC

## 2022-05-23 MED ORDER — DEXAMETHASONE SODIUM PHOSPHATE 10 MG/ML IJ SOLN
10.0000 mg | Freq: Once | INTRAMUSCULAR | Status: AC
  Administered 2022-05-23: 10 mg via INTRAMUSCULAR
  Filled 2022-05-23: qty 1

## 2022-05-23 MED ORDER — HYDROCODONE-ACETAMINOPHEN 5-325 MG PO TABS: 1.0000 | ORAL_TABLET | Freq: Four times a day (QID) | ORAL | 0 refills | Status: AC | PRN

## 2022-05-23 NOTE — ED Provider Notes (Signed)
[facility] Provider Note    Event Date/Time   First MD Initiated Contact with Patient 05/23/22 1256     (approximate)   History   Leg Pain (R)   HPI  Rodney Schmidt is a 77 y.o. male with history of anemia, osteoarthritis, and as listed in EMR presents to the emergency department for treatment and evaluation of right-sided sciatica.  He was evaluated here about 3 weeks ago for the same.  X-ray was negative.  He was improving until today when he bent over to pick something up and upon standing had a sharp, shock like pain shoot down the right lower extremity.  No additional injury..      Physical Exam   Triage Vital Signs: ED Triage Vitals  Enc Vitals Group     BP 05/23/22 1155 (!) 150/86     Pulse Rate 05/23/22 1155 82     Resp 05/23/22 1155 20     Temp 05/23/22 1155 98 F (36.7 C)     Temp Source 05/23/22 1155 Oral     SpO2 05/23/22 1155 97 %     Weight --      Height --      Head Circumference --      Peak Flow --      Pain Score 05/23/22 1154 7     Pain Loc --      Pain Edu? --      Excl. in [place]? --     Most recent vital signs: Vitals:   05/23/22 1155 05/23/22 1328  BP: (!) 150/86 (!) 148/72  Pulse: 82 72  Resp: 20 16  Temp: 98 F (36.7 C)   SpO2: 97% 98%    General: Awake, no distress.  CV:  Good peripheral perfusion.  Resp:  Normal effort.  Abd:  No distention.  Other:  Right lower extremity pain with straight leg raise. Strength equal in lower extremities.    ED Results / Procedures / Treatments   Labs (all labs ordered are listed, but only abnormal results are displayed) Labs Reviewed - No data to display   EKG  Not indicated.   RADIOLOGY   Not indicated.  PROCEDURES:  Critical Care performed: No  Procedures   MEDICATIONS ORDERED IN ED:  Medications  dexamethasone (DECADRON) injection 10 mg (10 mg Intramuscular Given 05/23/22 1329)     IMPRESSION / MDM / ASSESSMENT AND PLAN / ED COURSE   I  have reviewed the triage note.  Differential diagnosis includes, but is not limited to, sciatica, lumbar radiculopathy, cauda equina  Patient's presentation is most consistent with acute illness / injury with system symptoms.  77 year old male presenting to the emergency department for treatment and evaluation of radicular pain from the right buttock to the ankle.  Similar symptoms in the past.  No new injury since his last visit here April 3 for the same complaint.  Chart from last visit reviewed.  X-ray showed degenerative disc disease but otherwise no acute findings.  Plan will be to treat him with a prednisone taper and Norco and have him follow up with orthopedics if not improving over the week.        FINAL CLINICAL IMPRESSION(S) / ED DIAGNOSES   Final diagnoses:  Sciatica of right side     Rx / DC Orders   ED Discharge Orders          Ordered    predniSONE (STERAPRED UNI-PAK 21 TAB) 10 MG (21) TBPK  tablet        05/23/22 1312    HYDROcodone-acetaminophen (NORCO/VICODIN) 5-325 MG tablet  Every 6 hours PRN        05/23/22 1312             Note:  This document was prepared using Dragon voice recognition software and may include unintentional dictation errors.   Chinita Pester, FNP 05/23/22 1545    Jene Every, MD 05/23/22 [phone]

## 2022-05-23 NOTE — ED Notes (Signed)
Pt arrives ambulatory to triage w/c/o right hip/leg pain that pt states started this morning. Pt was seen on 04/30/22 for same. Pts wife states pt took 3 200mg  aleve this morning with no relief of pain pt is c/o 6/10 pain. Wife at bedside.

## 2022-05-23 NOTE — ED Triage Notes (Signed)
Pt to ED via POV from home. Pt reports hx of sciatica and was seen here on 4/3 and placed on PRN medication. Pt reports PRN medication was working until this morning and the pain worsened. Pt denies any injuries. Pt reports he did bend over to pick something up and the pain worsened.

## 2024-03-24 ENCOUNTER — Ambulatory Visit: Admitting: Urology
